# Patient Record
Sex: Female | Born: 1940
Health system: Southern US, Community
[De-identification: ages and names within clinical notes are randomized; demographics above are authoritative.]

## PROBLEM LIST (undated history)

## (undated) DIAGNOSIS — E785 Hyperlipidemia, unspecified: Secondary | ICD-10-CM

## (undated) DIAGNOSIS — I10 Essential (primary) hypertension: Secondary | ICD-10-CM

---

## 2006-05-25 ENCOUNTER — Ambulatory Visit (HOSPITAL_COMMUNITY): Admission: RE | Admit: 2006-05-25 | Discharge: 2006-05-25 | Payer: Self-pay | Admitting: Internal Medicine

## 2007-03-08 ENCOUNTER — Ambulatory Visit (HOSPITAL_COMMUNITY): Admission: RE | Admit: 2007-03-08 | Discharge: 2007-03-08 | Payer: Self-pay | Admitting: Internal Medicine

## 2007-06-12 ENCOUNTER — Ambulatory Visit (HOSPITAL_COMMUNITY): Admission: RE | Admit: 2007-06-12 | Discharge: 2007-06-12 | Payer: Self-pay | Admitting: Internal Medicine

## 2007-06-26 ENCOUNTER — Other Ambulatory Visit: Admission: RE | Admit: 2007-06-26 | Discharge: 2007-06-26 | Payer: Self-pay | Admitting: Obstetrics and Gynecology

## 2007-08-24 ENCOUNTER — Emergency Department (HOSPITAL_COMMUNITY): Admission: EM | Admit: 2007-08-24 | Discharge: 2007-08-24 | Payer: Self-pay | Admitting: Emergency Medicine

## 2008-05-19 ENCOUNTER — Ambulatory Visit (HOSPITAL_COMMUNITY): Admission: RE | Admit: 2008-05-19 | Discharge: 2008-05-19 | Payer: Self-pay | Admitting: Internal Medicine

## 2008-07-27 ENCOUNTER — Ambulatory Visit (HOSPITAL_COMMUNITY): Admission: RE | Admit: 2008-07-27 | Discharge: 2008-07-27 | Payer: Self-pay | Admitting: Internal Medicine

## 2009-08-16 ENCOUNTER — Ambulatory Visit (HOSPITAL_COMMUNITY): Admission: RE | Admit: 2009-08-16 | Discharge: 2009-08-16 | Payer: Self-pay | Admitting: Internal Medicine

## 2010-09-12 ENCOUNTER — Other Ambulatory Visit (HOSPITAL_COMMUNITY): Payer: Self-pay | Admitting: Internal Medicine

## 2010-09-12 DIAGNOSIS — Z139 Encounter for screening, unspecified: Secondary | ICD-10-CM

## 2010-09-20 ENCOUNTER — Ambulatory Visit (HOSPITAL_COMMUNITY)
Admission: RE | Admit: 2010-09-20 | Discharge: 2010-09-20 | Disposition: A | Discharge status: Home / Self Care | Payer: Medicare Other | Source: Ambulatory Visit | Attending: Internal Medicine | Admitting: Internal Medicine

## 2010-09-20 DIAGNOSIS — Z1231 Encounter for screening mammogram for malignant neoplasm of breast: Secondary | ICD-10-CM | POA: Insufficient documentation

## 2010-09-20 DIAGNOSIS — Z139 Encounter for screening, unspecified: Secondary | ICD-10-CM

## 2010-11-04 LAB — URINALYSIS, ROUTINE W REFLEX MICROSCOPIC
Bilirubin Urine: NEGATIVE
Ketones, ur: NEGATIVE
Nitrite: NEGATIVE
Protein, ur: 30 — AB
pH: 5

## 2010-11-04 LAB — POCT CARDIAC MARKERS
Operator id: 166561
Troponin i, poc: <0.05

## 2010-11-04 LAB — LIPASE, BLOOD: Lipase: 16

## 2010-11-04 LAB — URINE CULTURE

## 2010-11-04 LAB — DIFFERENTIAL
Eosinophils Absolute: 0
Lymphocytes Relative: 1 — ABNORMAL LOW
Monocytes Relative: 4

## 2010-11-04 LAB — BASIC METABOLIC PANEL
BUN: 14
CO2: 27
Calcium: 8.9
Creatinine, Ser: 1.58 — ABNORMAL HIGH
Glucose, Bld: 160 — ABNORMAL HIGH

## 2010-11-04 LAB — CBC
MCHC: 33.9
MCV: 95.4
RDW: 13

## 2010-11-04 LAB — URINE MICROSCOPIC-ADD ON

## 2011-01-24 ENCOUNTER — Other Ambulatory Visit (HOSPITAL_COMMUNITY): Payer: Self-pay | Admitting: Internal Medicine

## 2011-01-24 ENCOUNTER — Ambulatory Visit (HOSPITAL_COMMUNITY)
Admission: RE | Admit: 2011-01-24 | Discharge: 2011-01-24 | Disposition: A | Discharge status: Home / Self Care | Payer: Medicare Other | Source: Ambulatory Visit | Attending: Internal Medicine | Admitting: Internal Medicine

## 2011-01-24 DIAGNOSIS — M25559 Pain in unspecified hip: Secondary | ICD-10-CM | POA: Insufficient documentation

## 2011-01-24 DIAGNOSIS — D259 Leiomyoma of uterus, unspecified: Secondary | ICD-10-CM | POA: Insufficient documentation

## 2011-01-24 DIAGNOSIS — M25552 Pain in left hip: Secondary | ICD-10-CM

## 2011-06-27 ENCOUNTER — Other Ambulatory Visit (HOSPITAL_COMMUNITY): Payer: Self-pay | Admitting: Internal Medicine

## 2011-07-04 ENCOUNTER — Ambulatory Visit (HOSPITAL_COMMUNITY)
Admission: RE | Admit: 2011-07-04 | Discharge: 2011-07-04 | Disposition: A | Discharge status: Home / Self Care | Payer: Medicare Other | Source: Ambulatory Visit | Attending: Internal Medicine | Admitting: Internal Medicine

## 2011-07-04 DIAGNOSIS — Z780 Asymptomatic menopausal state: Secondary | ICD-10-CM | POA: Insufficient documentation

## 2011-07-04 DIAGNOSIS — E559 Vitamin D deficiency, unspecified: Secondary | ICD-10-CM | POA: Insufficient documentation

## 2011-10-04 ENCOUNTER — Other Ambulatory Visit (HOSPITAL_COMMUNITY): Payer: Self-pay | Admitting: Internal Medicine

## 2011-10-04 DIAGNOSIS — Z139 Encounter for screening, unspecified: Secondary | ICD-10-CM

## 2011-10-13 ENCOUNTER — Ambulatory Visit (HOSPITAL_COMMUNITY)
Admission: RE | Admit: 2011-10-13 | Discharge: 2011-10-13 | Disposition: A | Discharge status: Home / Self Care | Payer: Medicare Other | Source: Ambulatory Visit | Attending: Internal Medicine | Admitting: Internal Medicine

## 2011-10-13 DIAGNOSIS — Z1231 Encounter for screening mammogram for malignant neoplasm of breast: Secondary | ICD-10-CM | POA: Insufficient documentation

## 2011-10-13 DIAGNOSIS — Z139 Encounter for screening, unspecified: Secondary | ICD-10-CM

## 2012-12-02 ENCOUNTER — Other Ambulatory Visit (HOSPITAL_COMMUNITY): Payer: Self-pay | Admitting: Internal Medicine

## 2012-12-02 DIAGNOSIS — Z139 Encounter for screening, unspecified: Secondary | ICD-10-CM

## 2012-12-16 ENCOUNTER — Ambulatory Visit (HOSPITAL_COMMUNITY)
Admission: RE | Admit: 2012-12-16 | Discharge: 2012-12-16 | Disposition: A | Discharge status: Home / Self Care | Payer: Medicare Other | Source: Ambulatory Visit | Attending: Internal Medicine | Admitting: Internal Medicine

## 2012-12-16 DIAGNOSIS — Z139 Encounter for screening, unspecified: Secondary | ICD-10-CM

## 2012-12-16 DIAGNOSIS — Z1231 Encounter for screening mammogram for malignant neoplasm of breast: Secondary | ICD-10-CM | POA: Insufficient documentation

## 2013-01-30 ENCOUNTER — Telehealth: Payer: Self-pay | Admitting: Family Medicine

## 2013-01-30 NOTE — Telephone Encounter (Signed)
Paged by [facility] to the after hours telephone number.  Pt reported INR 4.0 today, currently on 5 mg qd.  Will hold today and tomorrow's dose and check in two days, told to call after hours line at that time to get further instructions on dosing.  Thanks, Twana First. Paulina Fusi, DO of [facility] 01/30/2013, 12:08 PM

## 2014-01-21 ENCOUNTER — Other Ambulatory Visit (HOSPITAL_COMMUNITY): Payer: Self-pay | Admitting: Internal Medicine

## 2014-01-21 DIAGNOSIS — Z1231 Encounter for screening mammogram for malignant neoplasm of breast: Secondary | ICD-10-CM

## 2014-01-26 ENCOUNTER — Ambulatory Visit (HOSPITAL_COMMUNITY)
Admission: RE | Admit: 2014-01-26 | Discharge: 2014-01-26 | Disposition: A | Discharge status: Home / Self Care | Payer: Medicare Other | Source: Ambulatory Visit | Attending: Internal Medicine | Admitting: Internal Medicine

## 2014-01-26 DIAGNOSIS — Z1231 Encounter for screening mammogram for malignant neoplasm of breast: Secondary | ICD-10-CM | POA: Insufficient documentation

## 2014-04-17 DIAGNOSIS — I10 Essential (primary) hypertension: Secondary | ICD-10-CM | POA: Diagnosis not present

## 2014-04-17 DIAGNOSIS — M1990 Unspecified osteoarthritis, unspecified site: Secondary | ICD-10-CM | POA: Diagnosis not present

## 2014-04-17 DIAGNOSIS — M539 Dorsopathy, unspecified: Secondary | ICD-10-CM | POA: Diagnosis not present

## 2014-04-17 DIAGNOSIS — E784 Other hyperlipidemia: Secondary | ICD-10-CM | POA: Diagnosis not present

## 2014-07-24 ENCOUNTER — Other Ambulatory Visit (HOSPITAL_COMMUNITY): Payer: Self-pay | Admitting: Internal Medicine

## 2014-07-24 DIAGNOSIS — M1990 Unspecified osteoarthritis, unspecified site: Secondary | ICD-10-CM

## 2014-07-24 DIAGNOSIS — R739 Hyperglycemia, unspecified: Secondary | ICD-10-CM | POA: Diagnosis not present

## 2014-07-24 DIAGNOSIS — E784 Other hyperlipidemia: Secondary | ICD-10-CM | POA: Diagnosis not present

## 2014-07-24 DIAGNOSIS — I10 Essential (primary) hypertension: Secondary | ICD-10-CM | POA: Diagnosis not present

## 2014-07-24 DIAGNOSIS — Z23 Encounter for immunization: Secondary | ICD-10-CM | POA: Diagnosis not present

## 2014-07-24 DIAGNOSIS — Z0000 Encounter for general adult medical examination without abnormal findings: Secondary | ICD-10-CM | POA: Diagnosis not present

## 2014-07-24 DIAGNOSIS — M539 Dorsopathy, unspecified: Secondary | ICD-10-CM | POA: Diagnosis not present

## 2014-07-28 ENCOUNTER — Other Ambulatory Visit (HOSPITAL_COMMUNITY): Payer: Self-pay | Admitting: Internal Medicine

## 2014-07-28 DIAGNOSIS — M1990 Unspecified osteoarthritis, unspecified site: Secondary | ICD-10-CM

## 2014-07-29 ENCOUNTER — Ambulatory Visit (HOSPITAL_COMMUNITY)
Admission: RE | Admit: 2014-07-29 | Discharge: 2014-07-29 | Disposition: A | Discharge status: Home / Self Care | Payer: Medicare Other | Source: Ambulatory Visit | Attending: Internal Medicine | Admitting: Internal Medicine

## 2014-07-29 DIAGNOSIS — Z780 Asymptomatic menopausal state: Secondary | ICD-10-CM | POA: Diagnosis not present

## 2014-07-29 DIAGNOSIS — M1990 Unspecified osteoarthritis, unspecified site: Secondary | ICD-10-CM | POA: Diagnosis not present

## 2014-07-29 DIAGNOSIS — M85851 Other specified disorders of bone density and structure, right thigh: Secondary | ICD-10-CM | POA: Diagnosis not present

## 2014-08-11 DIAGNOSIS — I10 Essential (primary) hypertension: Secondary | ICD-10-CM | POA: Diagnosis not present

## 2014-08-11 DIAGNOSIS — M542 Cervicalgia: Secondary | ICD-10-CM | POA: Diagnosis not present

## 2014-09-01 ENCOUNTER — Telehealth: Payer: Self-pay

## 2014-09-01 NOTE — Telephone Encounter (Signed)
PATIENT RECEIVED LETTER TO SCHEDULE COLONOSCOPY  PLEASE CALL [phone]

## 2014-09-02 ENCOUNTER — Telehealth: Payer: Self-pay

## 2014-09-02 ENCOUNTER — Other Ambulatory Visit: Payer: Self-pay

## 2014-09-02 DIAGNOSIS — Z1211 Encounter for screening for malignant neoplasm of colon: Secondary | ICD-10-CM

## 2014-09-02 NOTE — Telephone Encounter (Signed)
I have triaged pt.  

## 2014-09-02 NOTE — Telephone Encounter (Signed)
Patient called in to get her tcs scheduled.  I explained to the patient that Tamela Oddi is working on those triages and she will be calling her soon.

## 2014-09-03 NOTE — Telephone Encounter (Signed)
Gastroenterology Pre-Procedure Review  Request Date: 09/02/2014 Requesting Physician: Dr. Legrand Rams  PATIENT REVIEW QUESTIONS: The patient responded to the following health history questions as indicated:    PT SAID SHE HAD A COLONOSCOPY OVER 10 YEARS AGO IN [place] AND IT WAS OK  1. Diabetes Melitis: no 2. Joint replacements in the past 12 months: no 3. Major health problems in the past 3 months: no 4. Has an artificial valve or MVP: no 5. Has a defibrillator: no 6. Has been advised in past to take antibiotics in advance of a procedure like teeth cleaning: no    MEDICATIONS & ALLERGIES:    Patient reports the following regarding taking any blood thinners:   Plavix? no Aspirin? no Coumadin? no  Patient confirms/reports the following medications:  Current Outpatient Prescriptions  Medication Sig Dispense Refill  . lisinopril (PRINIVIL,ZESTRIL) 20 MG tablet Take 20 mg by mouth daily.    . Multiple Vitamin (MULTIVITAMIN) tablet Take 1 tablet by mouth daily.    . simvastatin (ZOCOR) 40 MG tablet Take 40 mg by mouth daily.     No current facility-administered medications for this visit.    Patient confirms/reports the following allergies:  No Known Allergies  No orders of the defined types were placed in this encounter.    AUTHORIZATION INFORMATION Primary Insurance:  ID #:   Group #:  Pre-Cert / Auth required:  Pre-Cert / Auth #:   Secondary Insurance:   ID #:  Group #:  Pre-Cert / Auth required:  Pre-Cert / Auth #:   SCHEDULE INFORMATION: Procedure has been scheduled as follows:  Date: 09/21/2014     Time: 1:45 PM Location: [facility]  This Gastroenterology Pre-Precedure Review Form is being routed to the following provider(s): Barney Drain, MD

## 2014-09-03 NOTE — Telephone Encounter (Signed)
SUPREP SPLIT DOSING- FULL LIQUIDS WITH BREAKFAST.   Full Liquid Diet A high-calorie, high-protein supplement should be used to meet your nutritional requirements when the full liquid diet is continued for more than 2 or 3 days. If this diet is to be used for an extended period of time (more than 7 days), a multivitamin should be considered.  Breads and Starches  Allowed: None are allowed except crackers WHOLE OR pureed (made into a thick, smooth soup) in soup.   Avoid: Any others.    Potatoes/Pasta/Rice  Allowed: ANY ITEM AS A SOUP OR SMALL PLATE OF MASHED POTATOES.       Vegetables  Allowed: Strained tomato or vegetable juice. Vegetables pureed in soup.   Avoid: Any others.    Fruit  Allowed: Any strained fruit juices and fruit drinks. Include 1 serving of citrus or vitamin C-enriched fruit juice daily.   Avoid: Any others.  Meat and Meat Substitutes  Allowed: Egg  Avoid: Any meat, fish, or fowl. All cheese.  Milk  Allowed: Milk beverages, including milk shakes and instant breakfast mixes. Smooth yogurt.   Avoid: Any others. Avoid dairy products if not tolerated.    Soups and Combination Foods  Allowed: Broth, strained cream soups. Strained, broth-based soups.   Avoid: Any others.    Desserts and Sweets  Allowed: flavored gelatin,plain ice cream, sherbet, smooth pudding, junket, fruit ices, frozen ice pops, pudding pops,, frozen fudge pops, chocolate syrup. Sugar, honey, jelly, syrup.   Avoid: Any others.  Fats and Oils  Allowed: Margarine, butter, cream, sour cream, oils.   Avoid: Any others.  Beverages  Allowed: All.   Avoid: None.  Condiments  Allowed: Iodized salt, pepper, spices, flavorings. Cocoa powder.   Avoid: Any others.    SAMPLE MEAL PLAN Breakfast   cup orange juice.   1 OR 2 EGGS   1 cup  milk.   1 cup beverage (coffee or tea).   Cream or sugar, if desired.    Midmorning Snack  2 SCRAMBLED OR HARD BOILED  EGG   Lunch  1 cup cream soup.    cup fruit juice.   1 cup milk.    cup custard.   1 cup beverage (coffee or tea).   Cream or sugar, if desired.    Midafternoon Snack  1 cup milk shake.  Dinner  1 cup cream soup.    cup fruit juice.   1 cup milk.    cup pudding.   1 cup beverage (coffee or tea).   Cream or sugar, if desired.  Evening Snack  1 cup supplement.  To increase calories, add sugar, cream, butter, or margarine if possible. Nutritional supplements will also increase the total calories.

## 2014-09-04 MED ORDER — NA SULFATE-K SULFATE-MG SULF 17.5-3.13-1.6 GM/177ML PO SOLN: 1.0000 | ORAL | Status: DC

## 2014-09-04 NOTE — Telephone Encounter (Signed)
Rx sent to the pharmacy and instructions mailed to pt.  

## 2014-09-07 ENCOUNTER — Telehealth: Payer: Self-pay

## 2014-09-07 NOTE — Telephone Encounter (Signed)
Per Ginger, the pharmacy called and the SUPREP was not covered by insurance. She gave verbal for Trilyte prep. I am mailing new instructions for the Trilyte prep and pt is aware to discard previous instructions.

## 2014-09-09 ENCOUNTER — Telehealth: Payer: Self-pay

## 2014-09-09 NOTE — Telephone Encounter (Signed)
I called [employer] @ [phone] and spoke to [place] who said that a PA is not required for screening colonoscopy.

## 2014-09-21 ENCOUNTER — Ambulatory Visit (HOSPITAL_COMMUNITY)
Admission: RE | Admit: 2014-09-21 | Discharge: 2014-09-21 | Disposition: A | Discharge status: Home / Self Care | Payer: Medicare Other | Source: Ambulatory Visit | Attending: Gastroenterology | Admitting: Gastroenterology

## 2014-09-21 ENCOUNTER — Encounter (HOSPITAL_COMMUNITY): Payer: Self-pay

## 2014-09-21 ENCOUNTER — Encounter (HOSPITAL_COMMUNITY)
Admission: RE | Disposition: A | Discharge status: Home / Self Care | Payer: Self-pay | Source: Ambulatory Visit | Attending: Gastroenterology

## 2014-09-21 DIAGNOSIS — Z87891 Personal history of nicotine dependence: Secondary | ICD-10-CM | POA: Insufficient documentation

## 2014-09-21 DIAGNOSIS — Q438 Other specified congenital malformations of intestine: Secondary | ICD-10-CM | POA: Insufficient documentation

## 2014-09-21 DIAGNOSIS — K648 Other hemorrhoids: Secondary | ICD-10-CM | POA: Insufficient documentation

## 2014-09-21 DIAGNOSIS — Z79899 Other long term (current) drug therapy: Secondary | ICD-10-CM | POA: Insufficient documentation

## 2014-09-21 DIAGNOSIS — I10 Essential (primary) hypertension: Secondary | ICD-10-CM | POA: Insufficient documentation

## 2014-09-21 DIAGNOSIS — Z1211 Encounter for screening for malignant neoplasm of colon: Secondary | ICD-10-CM

## 2014-09-21 DIAGNOSIS — K635 Polyp of colon: Secondary | ICD-10-CM | POA: Diagnosis not present

## 2014-09-21 DIAGNOSIS — K621 Rectal polyp: Secondary | ICD-10-CM

## 2014-09-21 DIAGNOSIS — E785 Hyperlipidemia, unspecified: Secondary | ICD-10-CM | POA: Diagnosis not present

## 2014-09-21 DIAGNOSIS — D127 Benign neoplasm of rectosigmoid junction: Secondary | ICD-10-CM | POA: Diagnosis not present

## 2014-09-21 DIAGNOSIS — K5730 Diverticulosis of large intestine without perforation or abscess without bleeding: Secondary | ICD-10-CM | POA: Insufficient documentation

## 2014-09-21 HISTORY — DX: Essential (primary) hypertension: I10

## 2014-09-21 HISTORY — DX: Hyperlipidemia, unspecified: E78.5

## 2014-09-21 HISTORY — PX: COLONOSCOPY: SHX5424

## 2014-09-21 SURGERY — COLONOSCOPY
Anesthesia: Moderate Sedation

## 2014-09-21 MED ORDER — MIDAZOLAM HCL 5 MG/5ML IJ SOLN
INTRAMUSCULAR | Status: AC
  Filled 2014-09-21: qty 10

## 2014-09-21 MED ORDER — SODIUM CHLORIDE 0.9 % IV SOLN
INTRAVENOUS | Status: DC
  Administered 2014-09-21: 14:00:00 via INTRAVENOUS

## 2014-09-21 MED ORDER — MEPERIDINE HCL 100 MG/ML IJ SOLN
INTRAMUSCULAR | Status: AC
  Filled 2014-09-21: qty 2

## 2014-09-21 MED ORDER — HYDROCORTISONE ACE-PRAMOXINE 1-1 % RE CREA: TOPICAL_CREAM | RECTAL | Status: AC

## 2014-09-21 MED ORDER — STERILE WATER FOR IRRIGATION IR SOLN
Status: DC | PRN
  Administered 2014-09-21: 15:00:00

## 2014-09-21 MED ORDER — MEPERIDINE HCL 100 MG/ML IJ SOLN
INTRAMUSCULAR | Status: DC | PRN
  Administered 2014-09-21 (×4): 25 mg via INTRAVENOUS

## 2014-09-21 MED ORDER — MIDAZOLAM HCL 5 MG/5ML IJ SOLN
INTRAMUSCULAR | Status: DC | PRN
  Administered 2014-09-21 (×3): 2 mg via INTRAVENOUS

## 2014-09-21 NOTE — Telephone Encounter (Addendum)
PT HAS NEVER BEEN ON COUMADIN. ROUTING TO DR. Langley Gauss CORRECTION.

## 2014-09-21 NOTE — Op Note (Addendum)
[facility] [address] [place], [address]   COLONOSCOPY PROCEDURE REPORT  PATIENT: Marisa Graham, Marisa Graham  MR#: [id number] BIRTHDATE: 1940-09-22 , 73  yrs. old GENDER: female ENDOSCOPIST: Danie Binder, MD REFERRED EY:CXKGYJEH Fanta, M.D. PROCEDURE DATE:  10/19/14 PROCEDURE:   Colonoscopy with cold biopsy polypectomy INDICATIONS:average risk patient for colon cancer. MEDICATIONS: Demerol 100 mg IV and Versed 6 mg IV  DESCRIPTION OF PROCEDURE:    Physical exam was performed.  Informed consent was obtained from the patient after explaining the benefits, risks, and alternatives to procedure.  The patient was connected to monitor and placed in left lateral position. Continuous oxygen was provided by nasal cannula and IV medicine administered through an indwelling cannula.  After administration of sedation and rectal exam, the patients rectum was intubated and the EC-[id number] ([id number])  colonoscope was advanced under direct visualization to the cecum.  The scope was removed slowly by carefully examining the color, texture, anatomy, and integrity mucosa on the way out.  The patient was recovered in endoscopy and discharged home in satisfactory condition. Estimated blood loss is zero unless otherwise noted in this procedure report.    COLON FINDINGS: The colon was redundant.  The patient was moved on to their back to reach the cecum, There was moderate diverticulosis noted in the sigmoid colon and descending colon with associated muscular hypertrophy, colonic narrowing and angulation AT THE RECTOSIGMOID JUNCTION. DIFFICULT TO PASS ADULT COLONOSCOPE. Two sessile polyps ranging from 2 to 63mm in size were found in the rectum and sigmoid colon.  A polypectomy was performed with cold forceps.  , and Moderate sized internal hemorrhoids were found.  PREP QUALITY: excellent.  CECAL W/D TIME: 13       minutes COMPLICATIONS: Pt agitated in spite of adequate sedation.  No recall.  ENDOSCOPIC IMPRESSION: 1.   The LEFT colon IS redundant 2.   Moderate diverticulosis  in the sigmoid colon and descending colon 3.   Two POLYPS REMOVED 4.   Moderate sized internal hemorrhoids  RECOMMENDATIONS: DRINK WATER HIGH FIBER DIET NEXT TCS IN 10-15 YEARS IF THE BENEFITS OUTWEIGH THE RISKS    _______________________________ eSignedDanie Binder, MD 10-19-14 4:17 PM Revised: 10-19-2014 4:17 PM   CPT CODES: ICD CODES:  The ICD and CPT codes recommended by this software are interpretations from the data that the clinical staff has captured with the software.  The verification of the translation of this report to the ICD and CPT codes and modifiers is the sole responsibility of the health care institution and practicing physician where this report was generated.  [employer] will not be held responsible for the validity of the ICD and CPT codes included on this report.  AMA assumes no liability for data contained or not contained herein. CPT is a Designer, television/film set of the [employer].

## 2014-09-21 NOTE — Discharge Instructions (Signed)
You had 2 small polyps removed. You have internal hemorrhoids and diverticulosis IN YOUR RIGHT AND LEFT COLON.   DRINK WATER TO KEEP YOUR URINE LIGHT YELLOW.  FOLLOW A HIGH FIBER DIET. AVOID ITEMS THAT CAUSE BLOATING. SEE INFO BELOW.  USE PREPARATION H or Proctocream 2 TO 4 TIMES A DAY AS NEEDED TO RELIEVE RECTAL PRESSURE/ITCHING/BLEEDING.  YOUR BIOPSY RESULTS WILL BE AVAILABLE IN MY CHART AFTER AUG 17 AND MY OFFICE WILL CONTACT YOU IN 10-14 DAYS WITH YOUR RESULTS.   Next colonoscopy in 10-15 years IF THE BENEFITS OUTWEIGH THE RISKS.  Colonoscopy Care After Read the instructions outlined below and refer to this sheet in the next week. These discharge instructions provide you with general information on caring for yourself after you leave the hospital. While your treatment has been planned according to the most current medical practices available, unavoidable complications occasionally occur. If you have any problems or questions after discharge, call DR. Haifa Hatton, [phone].  ACTIVITY  You may resume your regular activity, but move at a slower pace for the next 24 hours.   Take frequent rest periods for the next 24 hours.   Walking will help get rid of the air and reduce the bloated feeling in your belly (abdomen).   No driving for 24 hours (because of the medicine (anesthesia) used during the test).   You may shower.   Do not sign any important legal documents or operate any machinery for 24 hours (because of the anesthesia used during the test).    NUTRITION  Drink plenty of fluids.   You may resume your normal diet as instructed by your doctor.   Begin with a light meal and progress to your normal diet. Heavy or fried foods are harder to digest and may make you feel sick to your stomach (nauseated).   Avoid alcoholic beverages for 24 hours or as instructed.    MEDICATIONS  You may resume your normal medications.   WHAT YOU CAN EXPECT TODAY  Some feelings of  bloating in the abdomen.   Passage of more gas than usual.   Spotting of blood in your stool or on the toilet paper  .  IF YOU HAD POLYPS REMOVED DURING THE COLONOSCOPY:  Eat a soft diet IF YOU HAVE NAUSEA, BLOATING, ABDOMINAL PAIN, OR VOMITING.    FINDING OUT THE RESULTS OF YOUR TEST Not all test results are available during your visit. DR. Oneida Alar WILL CALL YOU WITHIN 7 DAYS OF YOUR PROCEDUE WITH YOUR RESULTS. Do not assume everything is normal if you have not heard from DR. Mickle Campton IN ONE WEEK, CALL HER OFFICE AT [phone].  SEEK IMMEDIATE MEDICAL ATTENTION AND CALL THE OFFICE: [phone] IF:  You have more than a spotting of blood in your stool.   Your belly is swollen (abdominal distention).   You are nauseated or vomiting.   You have a temperature over 101F.   You have abdominal pain or discomfort that is severe or gets worse throughout the day.  Polyps, Colon  A polyp is extra tissue that grows inside your body. Colon polyps grow in the large intestine. The large intestine, also called the colon, is part of your digestive system. It is a long, hollow tube at the end of your digestive tract where your body makes and stores stool. Most polyps are not dangerous. They are benign. This means they are not cancerous. But over time, some types of polyps can turn into cancer. Polyps that are smaller than a pea  are usually not harmful. But larger polyps could someday become or may already be cancerous. To be safe, doctors remove all polyps and test them.   WHO GETS POLYPS? Anyone can get polyps, but certain people are more likely than others. You may have a greater chance of getting polyps if:  You are over 50.   You have had polyps before.   Someone in your family has had polyps.   Someone in your family has had cancer of the large intestine.   Find out if someone in your family has had polyps. You may also be more likely to get polyps if you:   Eat a lot of fatty foods    Smoke   Drink alcohol   Do not exercise  Eat too much   TREATMENT  The caregiver will remove the polyp during sigmoidoscopy or colonoscopy.  PREVENTION There is not one sure way to prevent polyps. You might be able to lower your risk of getting them if you:  Eat more fruits and vegetables and less fatty food.   Do not smoke.   Avoid alcohol.   Exercise every day.   Lose weight if you are overweight.   Eating more calcium and folate can also lower your risk of getting polyps. Some foods that are rich in calcium are milk, cheese, and broccoli. Some foods that are rich in folate are chickpeas, kidney beans, and spinach.   High-Fiber Diet A high-fiber diet changes your normal diet to include more whole grains, legumes, fruits, and vegetables. Changes in the diet involve replacing refined carbohydrates with unrefined foods. The calorie level of the diet is essentially unchanged. The Dietary Reference Intake (recommended amount) for adult males is 38 grams per day. For adult females, it is 25 grams per day. Pregnant and lactating women should consume 28 grams of fiber per day. Fiber is the intact part of a plant that is not broken down during digestion. Functional fiber is fiber that has been isolated from the plant to provide a beneficial effect in the body. PURPOSE  Increase stool bulk.   Ease and regulate bowel movements.   Lower cholesterol.  INDICATIONS THAT YOU NEED MORE FIBER  Constipation and hemorrhoids.   Uncomplicated diverticulosis (intestine condition) and irritable bowel syndrome.   Weight management.   As a protective measure against hardening of the arteries (atherosclerosis), diabetes, and cancer.   GUIDELINES FOR INCREASING FIBER IN THE DIET  Start adding fiber to the diet slowly. A gradual increase of about 5 more grams (2 slices of whole-wheat bread, 2 servings of most fruits or vegetables, or 1 bowl of high-fiber cereal) per day is best. Too rapid an  increase in fiber may result in constipation, flatulence, and bloating.   Drink enough water and fluids to keep your urine clear or pale yellow. Water, juice, or caffeine-free drinks are recommended. Not drinking enough fluid may cause constipation.   Eat a variety of high-fiber foods rather than one type of fiber.   Try to increase your intake of fiber through using high-fiber foods rather than fiber pills or supplements that contain small amounts of fiber.   The goal is to change the types of food eaten. Do not supplement your present diet with high-fiber foods, but replace foods in your present diet.  INCLUDE A VARIETY OF FIBER SOURCES  Replace refined and processed grains with whole grains, canned fruits with fresh fruits, and incorporate other fiber sources. White rice, white breads, and most bakery goods  contain little or no fiber.   Brown whole-grain rice, buckwheat oats, and many fruits and vegetables are all good sources of fiber. These include: broccoli, Brussels sprouts, cabbage, cauliflower, beets, sweet potatoes, white potatoes (skin on), carrots, tomatoes, eggplant, squash, berries, fresh fruits, and dried fruits.   Cereals appear to be the richest source of fiber. Cereal fiber is found in whole grains and bran. Bran is the fiber-rich outer coat of cereal grain, which is largely removed in refining. In whole-grain cereals, the bran remains. In breakfast cereals, the largest amount of fiber is found in those with "bran" in their names. The fiber content is sometimes indicated on the label.   You may need to include additional fruits and vegetables each day.   In baking, for 1 cup white flour, you may use the following substitutions:   1 cup whole-wheat flour minus 2 tablespoons.   1/2 cup white flour plus 1/2 cup whole-wheat flour.   Diverticulosis Diverticulosis is a common condition that develops when small pouches (diverticula) form in the wall of the colon. The risk of  diverticulosis increases with age. It happens more often in people who eat a low-fiber diet. Most individuals with diverticulosis have no symptoms. Those individuals with symptoms usually experience belly (abdominal) pain, constipation, or loose stools (diarrhea).  HOME CARE INSTRUCTIONS  Increase the amount of fiber in your diet as directed by your caregiver or dietician. This may reduce symptoms of diverticulosis.   Drink at least 6 to 8 glasses of water each day to prevent constipation.   Try not to strain when you have a bowel movement.   Avoiding nuts and seeds to prevent complications is still an uncertain benefit.       FOODS HAVING HIGH FIBER CONTENT INCLUDE:  Fruits. Apple, peach, pear, tangerine, raisins, prunes.   Vegetables. Brussels sprouts, asparagus, broccoli, cabbage, carrot, cauliflower, romaine lettuce, spinach, summer squash, tomato, winter squash, zucchini.   Starchy Vegetables. Baked beans, kidney beans, lima beans, split peas, lentils, potatoes (with skin).   Grains. Whole wheat bread, brown rice, bran flake cereal, plain oatmeal, white rice, shredded wheat, bran muffins.    SEEK IMMEDIATE MEDICAL CARE IF:  You develop increasing pain or severe bloating.   You have an oral temperature above 101F.   You develop vomiting or bowel movements that are bloody or black.   Hemorrhoids Hemorrhoids are dilated (enlarged) veins around the rectum. Sometimes clots will form in the veins. This makes them swollen and painful. These are called thrombosed hemorrhoids. Causes of hemorrhoids include:  Constipation.   Straining to have a bowel movement.   HEAVY LIFTING HOME CARE INSTRUCTIONS  Eat a well balanced diet and drink 6 to 8 glasses of water every day to avoid constipation. You may also use a bulk laxative.   Avoid straining to have bowel movements.   Keep anal area dry and clean.   Do not use a donut shaped pillow or sit on the toilet for long periods.  This increases blood pooling and pain.   Move your bowels when your body has the urge; this will require less straining and will decrease pain and pressure.

## 2014-09-21 NOTE — H&P (Signed)
  Primary Care Physician:  Rosita Fire, MD Primary Gastroenterologist:  Dr. Oneida Alar  Pre-Procedure History & Physical: HPI:  Marisa Graham is a 74 y.o. female here for [place].  Past Medical History  Diagnosis Date  . Hypertension   . Hyperlipidemia     History reviewed. No pertinent past surgical history.  Prior to Admission medications   Medication Sig Start Date End Date Taking? Authorizing Provider  atorvastatin (LIPITOR) 40 MG tablet Take 40 mg by mouth daily.   Yes Historical Provider, MD  lisinopril-hydrochlorothiazide (PRINZIDE,ZESTORETIC) 20-12.5 MG per tablet Take 1 tablet by mouth daily.   Yes Historical Provider, MD  Multiple Vitamin (MULTIVITAMIN) tablet Take 1 tablet by mouth daily.   Yes Historical Provider, MD  Na Sulfate-K Sulfate-Mg Sulf (SUPREP BOWEL PREP) SOLN Take 1 kit by mouth as directed. 09/04/14  Yes Danie Binder, MD  Vitamin D, Ergocalciferol, (DRISDOL) 50000 UNITS CAPS capsule Take 50,000 Units by mouth every 7 (seven) days.   Yes Historical Provider, MD    Allergies as of 09/02/2014  . (No Known Allergies)    History reviewed. No pertinent family history.  Social History   Social History  . Marital Status: Married    Spouse Name: N/A  . Number of Children: N/A  . Years of Education: N/A   Occupational History  . Not on file.   Social History Main Topics  . Smoking status: Former Smoker    Quit date: 09/20/1984  . Smokeless tobacco: Not on file  . Alcohol Use: No  . Drug Use: No  . Sexual Activity: Not on file   Other Topics Concern  . Not on file   Social History Narrative  . No narrative on file    Review of Systems: See HPI, otherwise negative ROS   Physical Exam: Ht $Remove'5\' 2"'pOQrHVF$  (1.575 m)  Wt 133 lb (60.328 kg)  BMI 24.32 kg/m2 General:   Alert,  pleasant and cooperative in NAD Head:  Normocephalic and atraumatic. Neck:  Supple; Lungs:  Clear throughout to auscultation.    Heart:  Regular rate and  rhythm. Abdomen:  Soft, nontender and nondistended. Normal bowel sounds, without guarding, and without rebound.   Neurologic:  Alert and  oriented x4;  grossly normal neurologically.  Impression/Plan:     SCREENING  Plan:  1. TCS TODAY

## 2014-09-23 ENCOUNTER — Telehealth: Payer: Self-pay | Admitting: Gastroenterology

## 2014-09-24 NOTE — Telephone Encounter (Signed)
Please call pt. She had ONE HYPERPLASTIC POLYP AND ONE PERINEUROMA removed. THEY ARE BOTH BENIGN.   DRINK WATER TO KEEP YOUR URINE LIGHT YELLOW.  FOLLOW A HIGH FIBER DIET. AVOID ITEMS THAT CAUSE BLOATING.   USE PREPARATION H or Proctocream 2 TO 4 TIMES A DAY AS NEEDED TO RELIEVE RECTAL PRESSURE/ITCHING/BLEEDING.  Next colonoscopy in 10-15 years IF THE BENEFITS OUTWEIGH THE RISKS.

## 2014-09-24 NOTE — Telephone Encounter (Signed)
Reminder in epic °

## 2014-09-25 ENCOUNTER — Encounter (HOSPITAL_COMMUNITY): Payer: Self-pay | Admitting: Gastroenterology

## 2014-09-25 NOTE — Telephone Encounter (Signed)
Pt is aware of results. 

## 2014-10-30 DIAGNOSIS — Z23 Encounter for immunization: Secondary | ICD-10-CM | POA: Diagnosis not present

## 2014-10-30 DIAGNOSIS — M1990 Unspecified osteoarthritis, unspecified site: Secondary | ICD-10-CM | POA: Diagnosis not present

## 2014-10-30 DIAGNOSIS — I10 Essential (primary) hypertension: Secondary | ICD-10-CM | POA: Diagnosis not present

## 2014-12-21 ENCOUNTER — Other Ambulatory Visit (HOSPITAL_COMMUNITY): Payer: Self-pay | Admitting: Internal Medicine

## 2014-12-21 DIAGNOSIS — Z1231 Encounter for screening mammogram for malignant neoplasm of breast: Secondary | ICD-10-CM

## 2015-01-22 DIAGNOSIS — E784 Other hyperlipidemia: Secondary | ICD-10-CM | POA: Diagnosis not present

## 2015-01-22 DIAGNOSIS — M539 Dorsopathy, unspecified: Secondary | ICD-10-CM | POA: Diagnosis not present

## 2015-01-22 DIAGNOSIS — I10 Essential (primary) hypertension: Secondary | ICD-10-CM | POA: Diagnosis not present

## 2015-01-29 ENCOUNTER — Ambulatory Visit (HOSPITAL_COMMUNITY)
Admission: RE | Admit: 2015-01-29 | Discharge: 2015-01-29 | Disposition: A | Discharge status: Home / Self Care | Payer: Medicare Other | Source: Ambulatory Visit | Attending: Internal Medicine | Admitting: Internal Medicine

## 2015-01-29 DIAGNOSIS — Z1231 Encounter for screening mammogram for malignant neoplasm of breast: Secondary | ICD-10-CM | POA: Insufficient documentation

## 2015-04-30 DIAGNOSIS — M539 Dorsopathy, unspecified: Secondary | ICD-10-CM | POA: Diagnosis not present

## 2015-04-30 DIAGNOSIS — E784 Other hyperlipidemia: Secondary | ICD-10-CM | POA: Diagnosis not present

## 2015-04-30 DIAGNOSIS — I10 Essential (primary) hypertension: Secondary | ICD-10-CM | POA: Diagnosis not present

## 2015-04-30 DIAGNOSIS — M1990 Unspecified osteoarthritis, unspecified site: Secondary | ICD-10-CM | POA: Diagnosis not present

## 2015-04-30 DIAGNOSIS — R739 Hyperglycemia, unspecified: Secondary | ICD-10-CM | POA: Diagnosis not present

## 2015-08-27 DIAGNOSIS — I10 Essential (primary) hypertension: Secondary | ICD-10-CM | POA: Diagnosis not present

## 2015-08-27 DIAGNOSIS — E784 Other hyperlipidemia: Secondary | ICD-10-CM | POA: Diagnosis not present

## 2015-11-03 DIAGNOSIS — Z23 Encounter for immunization: Secondary | ICD-10-CM | POA: Diagnosis not present

## 2015-11-08 DIAGNOSIS — E784 Other hyperlipidemia: Secondary | ICD-10-CM | POA: Diagnosis not present

## 2015-11-08 DIAGNOSIS — E785 Hyperlipidemia, unspecified: Secondary | ICD-10-CM | POA: Diagnosis not present

## 2015-11-08 DIAGNOSIS — I10 Essential (primary) hypertension: Secondary | ICD-10-CM | POA: Diagnosis not present

## 2015-12-02 DIAGNOSIS — I10 Essential (primary) hypertension: Secondary | ICD-10-CM | POA: Diagnosis not present

## 2015-12-02 DIAGNOSIS — E784 Other hyperlipidemia: Secondary | ICD-10-CM | POA: Diagnosis not present

## 2016-01-07 ENCOUNTER — Other Ambulatory Visit (HOSPITAL_COMMUNITY): Payer: Self-pay | Admitting: Internal Medicine

## 2016-01-07 DIAGNOSIS — Z1231 Encounter for screening mammogram for malignant neoplasm of breast: Secondary | ICD-10-CM

## 2016-02-02 ENCOUNTER — Ambulatory Visit (HOSPITAL_COMMUNITY): Payer: Medicare Other

## 2016-02-11 ENCOUNTER — Ambulatory Visit (HOSPITAL_COMMUNITY)
Admission: RE | Admit: 2016-02-11 | Discharge: 2016-02-11 | Disposition: A | Discharge status: Home / Self Care | Payer: Medicare Other | Source: Ambulatory Visit | Attending: Internal Medicine | Admitting: Internal Medicine

## 2016-02-11 DIAGNOSIS — Z1231 Encounter for screening mammogram for malignant neoplasm of breast: Secondary | ICD-10-CM | POA: Insufficient documentation

## 2016-02-28 DIAGNOSIS — I10 Essential (primary) hypertension: Secondary | ICD-10-CM | POA: Diagnosis not present

## 2016-02-28 DIAGNOSIS — J069 Acute upper respiratory infection, unspecified: Secondary | ICD-10-CM | POA: Diagnosis not present

## 2016-04-20 DIAGNOSIS — I10 Essential (primary) hypertension: Secondary | ICD-10-CM | POA: Diagnosis not present

## 2016-04-20 DIAGNOSIS — E784 Other hyperlipidemia: Secondary | ICD-10-CM | POA: Diagnosis not present

## 2016-04-20 DIAGNOSIS — M1990 Unspecified osteoarthritis, unspecified site: Secondary | ICD-10-CM | POA: Diagnosis not present

## 2016-04-26 DIAGNOSIS — M2012 Hallux valgus (acquired), left foot: Secondary | ICD-10-CM | POA: Diagnosis not present

## 2016-04-26 DIAGNOSIS — M2011 Hallux valgus (acquired), right foot: Secondary | ICD-10-CM | POA: Diagnosis not present

## 2016-04-26 DIAGNOSIS — B351 Tinea unguium: Secondary | ICD-10-CM | POA: Diagnosis not present

## 2016-06-05 DIAGNOSIS — M539 Dorsopathy, unspecified: Secondary | ICD-10-CM | POA: Diagnosis not present

## 2016-06-05 DIAGNOSIS — R739 Hyperglycemia, unspecified: Secondary | ICD-10-CM | POA: Diagnosis not present

## 2016-06-05 DIAGNOSIS — M1990 Unspecified osteoarthritis, unspecified site: Secondary | ICD-10-CM | POA: Diagnosis not present

## 2016-06-05 DIAGNOSIS — I10 Essential (primary) hypertension: Secondary | ICD-10-CM | POA: Diagnosis not present

## 2016-06-05 DIAGNOSIS — E784 Other hyperlipidemia: Secondary | ICD-10-CM | POA: Diagnosis not present

## 2016-06-14 DIAGNOSIS — M1990 Unspecified osteoarthritis, unspecified site: Secondary | ICD-10-CM | POA: Diagnosis not present

## 2016-06-14 DIAGNOSIS — Z1389 Encounter for screening for other disorder: Secondary | ICD-10-CM | POA: Diagnosis not present

## 2016-06-14 DIAGNOSIS — E784 Other hyperlipidemia: Secondary | ICD-10-CM | POA: Diagnosis not present

## 2016-06-14 DIAGNOSIS — I10 Essential (primary) hypertension: Secondary | ICD-10-CM | POA: Diagnosis not present

## 2016-07-11 ENCOUNTER — Other Ambulatory Visit (HOSPITAL_COMMUNITY): Payer: Self-pay | Admitting: Internal Medicine

## 2016-07-11 ENCOUNTER — Ambulatory Visit (HOSPITAL_COMMUNITY)
Admission: RE | Admit: 2016-07-11 | Discharge: 2016-07-11 | Disposition: A | Discharge status: Home / Self Care | Payer: Medicare Other | Source: Ambulatory Visit | Attending: Internal Medicine | Admitting: Internal Medicine

## 2016-07-11 DIAGNOSIS — M25551 Pain in right hip: Secondary | ICD-10-CM | POA: Insufficient documentation

## 2016-07-11 DIAGNOSIS — M8580 Other specified disorders of bone density and structure, unspecified site: Secondary | ICD-10-CM | POA: Diagnosis not present

## 2016-07-11 DIAGNOSIS — D259 Leiomyoma of uterus, unspecified: Secondary | ICD-10-CM | POA: Diagnosis not present

## 2016-07-11 DIAGNOSIS — I70298 Other atherosclerosis of native arteries of extremities, other extremity: Secondary | ICD-10-CM | POA: Diagnosis not present

## 2016-09-14 DIAGNOSIS — I10 Essential (primary) hypertension: Secondary | ICD-10-CM | POA: Diagnosis not present

## 2016-09-14 DIAGNOSIS — E559 Vitamin D deficiency, unspecified: Secondary | ICD-10-CM | POA: Diagnosis not present

## 2016-09-14 DIAGNOSIS — E784 Other hyperlipidemia: Secondary | ICD-10-CM | POA: Diagnosis not present

## 2016-09-14 DIAGNOSIS — M1990 Unspecified osteoarthritis, unspecified site: Secondary | ICD-10-CM | POA: Diagnosis not present

## 2016-10-24 DIAGNOSIS — Z23 Encounter for immunization: Secondary | ICD-10-CM | POA: Diagnosis not present

## 2016-11-24 DIAGNOSIS — I10 Essential (primary) hypertension: Secondary | ICD-10-CM | POA: Diagnosis not present

## 2016-12-15 DIAGNOSIS — I10 Essential (primary) hypertension: Secondary | ICD-10-CM | POA: Diagnosis not present

## 2016-12-19 DIAGNOSIS — E7849 Other hyperlipidemia: Secondary | ICD-10-CM | POA: Diagnosis not present

## 2016-12-19 DIAGNOSIS — I10 Essential (primary) hypertension: Secondary | ICD-10-CM | POA: Diagnosis not present

## 2017-01-22 ENCOUNTER — Other Ambulatory Visit (HOSPITAL_COMMUNITY): Payer: Self-pay | Admitting: Internal Medicine

## 2017-01-22 DIAGNOSIS — Z1231 Encounter for screening mammogram for malignant neoplasm of breast: Secondary | ICD-10-CM

## 2017-02-12 ENCOUNTER — Ambulatory Visit (HOSPITAL_COMMUNITY)
Admission: RE | Admit: 2017-02-12 | Discharge: 2017-02-12 | Disposition: A | Discharge status: Home / Self Care | Payer: Medicare Other | Source: Ambulatory Visit | Attending: Internal Medicine | Admitting: Internal Medicine

## 2017-02-12 DIAGNOSIS — Z1231 Encounter for screening mammogram for malignant neoplasm of breast: Secondary | ICD-10-CM | POA: Diagnosis not present

## 2017-03-19 DIAGNOSIS — M1990 Unspecified osteoarthritis, unspecified site: Secondary | ICD-10-CM | POA: Diagnosis not present

## 2017-03-19 DIAGNOSIS — E7849 Other hyperlipidemia: Secondary | ICD-10-CM | POA: Diagnosis not present

## 2017-03-19 DIAGNOSIS — I10 Essential (primary) hypertension: Secondary | ICD-10-CM | POA: Diagnosis not present

## 2017-03-28 DIAGNOSIS — M79671 Pain in right foot: Secondary | ICD-10-CM | POA: Diagnosis not present

## 2017-03-28 DIAGNOSIS — B351 Tinea unguium: Secondary | ICD-10-CM | POA: Diagnosis not present

## 2017-03-28 DIAGNOSIS — M79672 Pain in left foot: Secondary | ICD-10-CM | POA: Diagnosis not present

## 2017-06-11 ENCOUNTER — Ambulatory Visit (HOSPITAL_COMMUNITY)
Admission: RE | Admit: 2017-06-11 | Discharge: 2017-06-11 | Disposition: A | Discharge status: Home / Self Care | Payer: Medicare Other | Source: Ambulatory Visit | Attending: Internal Medicine | Admitting: Internal Medicine

## 2017-06-11 ENCOUNTER — Other Ambulatory Visit (HOSPITAL_COMMUNITY): Payer: Self-pay | Admitting: Internal Medicine

## 2017-06-11 DIAGNOSIS — M25572 Pain in left ankle and joints of left foot: Secondary | ICD-10-CM | POA: Insufficient documentation

## 2017-06-11 DIAGNOSIS — M7989 Other specified soft tissue disorders: Secondary | ICD-10-CM | POA: Insufficient documentation

## 2017-06-11 DIAGNOSIS — I10 Essential (primary) hypertension: Secondary | ICD-10-CM | POA: Diagnosis not present

## 2017-06-18 DIAGNOSIS — E7849 Other hyperlipidemia: Secondary | ICD-10-CM | POA: Diagnosis not present

## 2017-06-18 DIAGNOSIS — M1990 Unspecified osteoarthritis, unspecified site: Secondary | ICD-10-CM | POA: Diagnosis not present

## 2017-06-18 DIAGNOSIS — I10 Essential (primary) hypertension: Secondary | ICD-10-CM | POA: Diagnosis not present

## 2017-06-18 DIAGNOSIS — R739 Hyperglycemia, unspecified: Secondary | ICD-10-CM | POA: Diagnosis not present

## 2017-06-18 DIAGNOSIS — Z1389 Encounter for screening for other disorder: Secondary | ICD-10-CM | POA: Diagnosis not present

## 2017-06-18 DIAGNOSIS — Z1331 Encounter for screening for depression: Secondary | ICD-10-CM | POA: Diagnosis not present

## 2017-06-18 DIAGNOSIS — Z0000 Encounter for general adult medical examination without abnormal findings: Secondary | ICD-10-CM | POA: Diagnosis not present

## 2017-06-18 DIAGNOSIS — M539 Dorsopathy, unspecified: Secondary | ICD-10-CM | POA: Diagnosis not present

## 2017-07-19 DIAGNOSIS — H25813 Combined forms of age-related cataract, bilateral: Secondary | ICD-10-CM | POA: Diagnosis not present

## 2017-09-20 DIAGNOSIS — M539 Dorsopathy, unspecified: Secondary | ICD-10-CM | POA: Diagnosis not present

## 2017-09-20 DIAGNOSIS — M1990 Unspecified osteoarthritis, unspecified site: Secondary | ICD-10-CM | POA: Diagnosis not present

## 2017-09-20 DIAGNOSIS — D649 Anemia, unspecified: Secondary | ICD-10-CM | POA: Diagnosis not present

## 2017-09-20 DIAGNOSIS — R739 Hyperglycemia, unspecified: Secondary | ICD-10-CM | POA: Diagnosis not present

## 2017-09-20 DIAGNOSIS — I10 Essential (primary) hypertension: Secondary | ICD-10-CM | POA: Diagnosis not present

## 2017-10-29 DIAGNOSIS — Z23 Encounter for immunization: Secondary | ICD-10-CM | POA: Diagnosis not present

## 2017-12-24 DIAGNOSIS — M12271 Villonodular synovitis (pigmented), right ankle and foot: Secondary | ICD-10-CM | POA: Diagnosis not present

## 2017-12-24 DIAGNOSIS — G5762 Lesion of plantar nerve, left lower limb: Secondary | ICD-10-CM | POA: Diagnosis not present

## 2017-12-24 DIAGNOSIS — M12272 Villonodular synovitis (pigmented), left ankle and foot: Secondary | ICD-10-CM | POA: Diagnosis not present

## 2017-12-24 DIAGNOSIS — M25775 Osteophyte, left foot: Secondary | ICD-10-CM | POA: Diagnosis not present

## 2017-12-24 DIAGNOSIS — M79672 Pain in left foot: Secondary | ICD-10-CM | POA: Diagnosis not present

## 2017-12-24 DIAGNOSIS — M25774 Osteophyte, right foot: Secondary | ICD-10-CM | POA: Diagnosis not present

## 2017-12-24 DIAGNOSIS — M79671 Pain in right foot: Secondary | ICD-10-CM | POA: Diagnosis not present

## 2018-01-02 DIAGNOSIS — M1990 Unspecified osteoarthritis, unspecified site: Secondary | ICD-10-CM | POA: Diagnosis not present

## 2018-01-02 DIAGNOSIS — E7849 Other hyperlipidemia: Secondary | ICD-10-CM | POA: Diagnosis not present

## 2018-01-02 DIAGNOSIS — I10 Essential (primary) hypertension: Secondary | ICD-10-CM | POA: Diagnosis not present

## 2018-01-14 ENCOUNTER — Other Ambulatory Visit (HOSPITAL_COMMUNITY): Payer: Self-pay | Admitting: Internal Medicine

## 2018-01-14 DIAGNOSIS — Z1231 Encounter for screening mammogram for malignant neoplasm of breast: Secondary | ICD-10-CM

## 2018-01-20 ENCOUNTER — Emergency Department (HOSPITAL_COMMUNITY)
Admission: EM | Admit: 2018-01-20 | Discharge: 2018-01-20 | Disposition: A | Discharge status: Home / Self Care | Payer: Medicare Other | Attending: Emergency Medicine | Admitting: Emergency Medicine

## 2018-01-20 ENCOUNTER — Emergency Department (HOSPITAL_COMMUNITY): Payer: Medicare Other

## 2018-01-20 ENCOUNTER — Other Ambulatory Visit: Payer: Self-pay

## 2018-01-20 ENCOUNTER — Encounter (HOSPITAL_COMMUNITY): Payer: Self-pay | Admitting: Emergency Medicine

## 2018-01-20 DIAGNOSIS — R42 Dizziness and giddiness: Secondary | ICD-10-CM | POA: Diagnosis not present

## 2018-01-20 DIAGNOSIS — Z79899 Other long term (current) drug therapy: Secondary | ICD-10-CM | POA: Diagnosis not present

## 2018-01-20 DIAGNOSIS — Z87891 Personal history of nicotine dependence: Secondary | ICD-10-CM | POA: Insufficient documentation

## 2018-01-20 DIAGNOSIS — I10 Essential (primary) hypertension: Secondary | ICD-10-CM | POA: Diagnosis not present

## 2018-01-20 DIAGNOSIS — R509 Fever, unspecified: Secondary | ICD-10-CM

## 2018-01-20 LAB — BASIC METABOLIC PANEL
Anion gap: 7 (ref 5–15)
BUN: 12 mg/dL (ref 8–23)
CO2: 27 mmol/L (ref 22–32)
Calcium: 8.9 mg/dL (ref 8.9–10.3)
Chloride: 102 mmol/L (ref 98–111)
Creatinine, Ser: 1.21 mg/dL — ABNORMAL HIGH (ref 0.44–1.00)
GFR calc Af Amer: 50 mL/min — ABNORMAL LOW (ref >60)
GFR calc non Af Amer: 43 mL/min — ABNORMAL LOW (ref >60)
Glucose, Bld: 120 mg/dL — ABNORMAL HIGH (ref 70–99)
Potassium: 3.3 mmol/L — ABNORMAL LOW (ref 3.5–5.1)
Sodium: 136 mmol/L (ref 135–145)

## 2018-01-20 LAB — CBC WITH DIFFERENTIAL/PLATELET
Abs Immature Granulocytes: 0.02 10*3/uL (ref 0.00–0.07)
Basophils Absolute: 0 10*3/uL (ref 0.0–0.1)
Basophils Relative: 0 %
Eosinophils Absolute: 0 10*3/uL (ref 0.0–0.5)
Eosinophils Relative: 0 %
HCT: 34.6 % — ABNORMAL LOW (ref 36.0–46.0)
Hemoglobin: 11.3 g/dL — ABNORMAL LOW (ref 12.0–15.0)
Immature Granulocytes: 0 %
Lymphocytes Relative: 14 %
Lymphs Abs: 1.2 10*3/uL (ref 0.7–4.0)
MCH: 31.3 pg (ref 26.0–34.0)
MCHC: 32.7 g/dL (ref 30.0–36.0)
MCV: 95.8 fL (ref 80.0–100.0)
Monocytes Absolute: 1.1 10*3/uL — ABNORMAL HIGH (ref 0.1–1.0)
Monocytes Relative: 13 %
Neutro Abs: 6.3 10*3/uL (ref 1.7–7.7)
Neutrophils Relative %: 73 %
Platelets: 228 10*3/uL (ref 150–400)
RBC: 3.61 MIL/uL — ABNORMAL LOW (ref 3.87–5.11)
RDW: 12.9 % (ref 11.5–15.5)
WBC: 8.6 10*3/uL (ref 4.0–10.5)
nRBC: 0 % (ref 0.0–0.2)

## 2018-01-20 LAB — URINALYSIS, ROUTINE W REFLEX MICROSCOPIC
Bilirubin Urine: NEGATIVE
Glucose, UA: NEGATIVE mg/dL
Hgb urine dipstick: NEGATIVE
Ketones, ur: NEGATIVE mg/dL
Leukocytes, UA: NEGATIVE
Nitrite: NEGATIVE
Protein, ur: NEGATIVE mg/dL
Specific Gravity, Urine: 1.005 (ref 1.005–1.030)
pH: 7 (ref 5.0–8.0)

## 2018-01-20 MED ORDER — ACETAMINOPHEN 325 MG PO TABS
650.0000 mg | ORAL_TABLET | Freq: Once | ORAL | Status: AC
  Administered 2018-01-20: 650 mg via ORAL
  Filled 2018-01-20: qty 2

## 2018-01-20 MED ORDER — LACTATED RINGERS IV BOLUS
500.0000 mL | Freq: Once | INTRAVENOUS | Status: AC
  Administered 2018-01-20: 500 mL via INTRAVENOUS

## 2018-01-20 NOTE — ED Notes (Signed)
Patient transported to X-ray 

## 2018-01-20 NOTE — ED Triage Notes (Addendum)
Pt with c/o dizziness since this afternoon and some balance problems. Pt very vague with symptoms. States she has had a runny nose and cough. Pt with fever of 101.6 in triage.

## 2018-02-01 NOTE — ED Provider Notes (Signed)
[facility] EMERGENCY DEPARTMENT Provider Note   CSN: [id number] Arrival date & time: 01/20/18  2137     History   Chief Complaint Chief Complaint  Patient presents with  . Dizziness    HPI Marisa Graham is a 77 y.o. female.  HPI   77 year old female with a vague sense of generally not faring well.  She feels dizzy but has a hard time characterizing this further.  Says she feels weak.  She has had a runny nose and little bit of a cough.  Noted to be febrile on arrival although she is not specifically felt like she had a fever.  No urinary complaints.  No acute respiratory complaints aside from the cough.  Denies any acute pain.  No acute rash.  Past Medical History:  Diagnosis Date  . Hyperlipidemia   . Hypertension     Patient Active Problem List   Diagnosis Date Noted  . Special screening for malignant neoplasms, colon     Past Surgical History:  Procedure Laterality Date  . COLONOSCOPY N/A 09/21/2014   Procedure: COLONOSCOPY;  Surgeon: Danie Binder, MD;  Location: [facility];  Service: Endoscopy;  Laterality: N/A;  2:00     OB History   No obstetric history on file.      Home Medications    Prior to Admission medications   Medication Sig Start Date End Date Taking? Authorizing Provider  amLODipine (NORVASC) 5 MG tablet Take 5 mg by mouth daily.   Yes [provider]  atorvastatin (LIPITOR) 40 MG tablet Take 40 mg by mouth daily.   Yes [provider]  lisinopril-hydrochlorothiazide (PRINZIDE,ZESTORETIC) 20-12.5 MG per tablet Take 1 tablet by mouth daily.   Yes [provider]  Multiple Vitamin (MULTIVITAMIN) tablet Take 1 tablet by mouth daily.   Yes [provider]  Vitamin D, Ergocalciferol, (DRISDOL) 50000 UNITS CAPS capsule Take 50,000 Units by mouth every 7 (seven) days.   Yes [provider]  pramoxine-hydrocortisone (PROCTOCREAM-HC) 1-1 % rectal cream USE PR 4 TIMES A DAY FOR 10 DAYS. 09/21/14    Danie Binder, MD    Family History No family history on file.  Social History Social History   Tobacco Use  . Smoking status: Former Smoker    Last attempt to quit: 09/20/1984    Years since quitting: 33.3  . Smokeless tobacco: Never Used  Substance Use Topics  . Alcohol use: No  . Drug use: No     Allergies   Patient has no known allergies.   Review of Systems Review of Systems  All systems reviewed and negative, other than as noted in HPI.  Physical Exam Updated Vital Signs BP (!) 132/56   Pulse 79   Temp (!) 101.6 F (38.7 C) (Oral)   Resp 18   Ht 5\' 2"  (1.575 m)   Wt 54.9 kg   SpO2 100%   BMI 22.13 kg/m   Physical Exam Vitals signs and nursing note reviewed.  Constitutional:      General: She is not in acute distress.    Appearance: She is well-developed.  HENT:     Head: Normocephalic and atraumatic.  Eyes:     General:        Right eye: No discharge.        Left eye: No discharge.     Conjunctiva/sclera: Conjunctivae normal.  Neck:     Musculoskeletal: Neck supple.  Cardiovascular:     Rate and Rhythm: Normal rate  and regular rhythm.     Heart sounds: Normal heart sounds. No murmur. No friction rub. No gallop.   Pulmonary:     Effort: Pulmonary effort is normal. No respiratory distress.     Breath sounds: Normal breath sounds.  Abdominal:     General: There is no distension.     Palpations: Abdomen is soft.     Tenderness: There is no abdominal tenderness.  Musculoskeletal:        General: No tenderness.  Skin:    General: Skin is warm and dry.  Neurological:     Mental Status: She is alert.  Psychiatric:        Behavior: Behavior normal.        Thought Content: Thought content normal.      ED Treatments / Results  Labs (all labs ordered are listed, but only abnormal results are displayed) Labs Reviewed  CBC WITH DIFFERENTIAL/PLATELET - Abnormal; Notable for the following components:      Result Value   RBC 3.61 (*)     Hemoglobin 11.3 (*)    HCT 34.6 (*)    Monocytes Absolute 1.1 (*)    All other components within normal limits  BASIC METABOLIC PANEL - Abnormal; Notable for the following components:   Potassium 3.3 (*)    Glucose, Bld 120 (*)    Creatinine, Ser 1.21 (*)    GFR calc non Af Amer 43 (*)    GFR calc Af Amer 50 (*)    All other components within normal limits  URINALYSIS, ROUTINE W REFLEX MICROSCOPIC - Abnormal; Notable for the following components:   Color, Urine STRAW (*)    All other components within normal limits    EKG None  Radiology No results found.   Dg Chest 2 View  Result Date: 01/20/2018 CLINICAL DATA:  Fever. Dizziness. EXAM: CHEST - 2 VIEW COMPARISON:  None. FINDINGS: The cardiomediastinal contours are normal. The lungs are clear. Pulmonary vasculature is normal. No consolidation, pleural effusion, or pneumothorax. No acute osseous abnormalities are seen. IMPRESSION: No acute pulmonary process. Electronically Signed   By: Keith Rake M.D.   On: 01/20/2018 23:02    Procedures Procedures (including critical care time)  Medications Ordered in ED Medications  acetaminophen (TYLENOL) tablet 650 mg (650 mg Oral Given 01/20/18 2210)  lactated ringers bolus 500 mL (0 mLs Intravenous Stopped 01/20/18 2236)     Initial Impression / Assessment and Plan / ED Course  I have reviewed the triage vital signs and the nursing notes.  Pertinent labs & imaging results that were available during my care of the patient were reviewed by me and considered in my medical decision making (see chart for details).     77 year old female with a vague sense of just not feeling well.  Likely from febrile illness. I'm not sure of the exact source of fever though.  She is nontoxic.  Exam is fairly reassuring.  ED work-up fairly unremarkable with regards to source of the infection.  Plan symptomatic treatment at this time.  Return precautions were discussed.  Final Clinical  Impressions(s) / ED Diagnoses   Final diagnoses:  Febrile illness, acute    ED Discharge Orders    None       Virgel Manifold, MD 02/01/18 1944

## 2018-02-18 ENCOUNTER — Ambulatory Visit (HOSPITAL_COMMUNITY)
Admission: RE | Admit: 2018-02-18 | Discharge: 2018-02-18 | Disposition: A | Discharge status: Home / Self Care | Payer: Medicare HMO | Source: Ambulatory Visit | Attending: Internal Medicine | Admitting: Internal Medicine

## 2018-02-18 DIAGNOSIS — Z1231 Encounter for screening mammogram for malignant neoplasm of breast: Secondary | ICD-10-CM | POA: Insufficient documentation

## 2018-03-13 DIAGNOSIS — G5762 Lesion of plantar nerve, left lower limb: Secondary | ICD-10-CM | POA: Diagnosis not present

## 2018-04-01 DIAGNOSIS — I10 Essential (primary) hypertension: Secondary | ICD-10-CM | POA: Diagnosis not present

## 2018-04-01 DIAGNOSIS — M1990 Unspecified osteoarthritis, unspecified site: Secondary | ICD-10-CM | POA: Diagnosis not present

## 2018-04-01 DIAGNOSIS — E785 Hyperlipidemia, unspecified: Secondary | ICD-10-CM | POA: Diagnosis not present

## 2018-06-13 DIAGNOSIS — E785 Hyperlipidemia, unspecified: Secondary | ICD-10-CM | POA: Diagnosis not present

## 2018-06-13 DIAGNOSIS — E559 Vitamin D deficiency, unspecified: Secondary | ICD-10-CM | POA: Diagnosis not present

## 2018-06-13 DIAGNOSIS — Z87891 Personal history of nicotine dependence: Secondary | ICD-10-CM | POA: Diagnosis not present

## 2018-06-13 DIAGNOSIS — I10 Essential (primary) hypertension: Secondary | ICD-10-CM | POA: Diagnosis not present

## 2018-07-02 DIAGNOSIS — Z1331 Encounter for screening for depression: Secondary | ICD-10-CM | POA: Diagnosis not present

## 2018-07-02 DIAGNOSIS — Z1389 Encounter for screening for other disorder: Secondary | ICD-10-CM | POA: Diagnosis not present

## 2018-07-02 DIAGNOSIS — Z0000 Encounter for general adult medical examination without abnormal findings: Secondary | ICD-10-CM | POA: Diagnosis not present

## 2018-07-02 DIAGNOSIS — M1990 Unspecified osteoarthritis, unspecified site: Secondary | ICD-10-CM | POA: Diagnosis not present

## 2018-07-02 DIAGNOSIS — I10 Essential (primary) hypertension: Secondary | ICD-10-CM | POA: Diagnosis not present

## 2018-07-02 DIAGNOSIS — E785 Hyperlipidemia, unspecified: Secondary | ICD-10-CM | POA: Diagnosis not present

## 2018-07-02 DIAGNOSIS — Z0001 Encounter for general adult medical examination with abnormal findings: Secondary | ICD-10-CM | POA: Diagnosis not present

## 2018-08-02 DIAGNOSIS — I10 Essential (primary) hypertension: Secondary | ICD-10-CM | POA: Diagnosis not present

## 2018-08-02 DIAGNOSIS — E785 Hyperlipidemia, unspecified: Secondary | ICD-10-CM | POA: Diagnosis not present

## 2018-09-01 DIAGNOSIS — E785 Hyperlipidemia, unspecified: Secondary | ICD-10-CM | POA: Diagnosis not present

## 2018-09-01 DIAGNOSIS — I10 Essential (primary) hypertension: Secondary | ICD-10-CM | POA: Diagnosis not present

## 2018-09-05 DIAGNOSIS — R0789 Other chest pain: Secondary | ICD-10-CM | POA: Diagnosis not present

## 2018-09-05 DIAGNOSIS — E785 Hyperlipidemia, unspecified: Secondary | ICD-10-CM | POA: Diagnosis not present

## 2018-09-05 DIAGNOSIS — I10 Essential (primary) hypertension: Secondary | ICD-10-CM | POA: Diagnosis not present

## 2018-10-06 DIAGNOSIS — I10 Essential (primary) hypertension: Secondary | ICD-10-CM | POA: Diagnosis not present

## 2018-10-06 DIAGNOSIS — E785 Hyperlipidemia, unspecified: Secondary | ICD-10-CM | POA: Diagnosis not present

## 2018-10-22 DIAGNOSIS — Z23 Encounter for immunization: Secondary | ICD-10-CM | POA: Diagnosis not present

## 2018-10-28 DIAGNOSIS — E785 Hyperlipidemia, unspecified: Secondary | ICD-10-CM | POA: Diagnosis not present

## 2018-10-28 DIAGNOSIS — M1990 Unspecified osteoarthritis, unspecified site: Secondary | ICD-10-CM | POA: Diagnosis not present

## 2018-11-01 DIAGNOSIS — Z0100 Encounter for examination of eyes and vision without abnormal findings: Secondary | ICD-10-CM | POA: Diagnosis not present

## 2018-11-01 DIAGNOSIS — H524 Presbyopia: Secondary | ICD-10-CM | POA: Diagnosis not present

## 2018-11-22 DIAGNOSIS — G5762 Lesion of plantar nerve, left lower limb: Secondary | ICD-10-CM | POA: Diagnosis not present

## 2018-11-27 DIAGNOSIS — I10 Essential (primary) hypertension: Secondary | ICD-10-CM | POA: Diagnosis not present

## 2018-11-27 DIAGNOSIS — E785 Hyperlipidemia, unspecified: Secondary | ICD-10-CM | POA: Diagnosis not present

## 2018-12-03 DIAGNOSIS — I10 Essential (primary) hypertension: Secondary | ICD-10-CM | POA: Diagnosis not present

## 2018-12-03 DIAGNOSIS — T465X5A Adverse effect of other antihypertensive drugs, initial encounter: Secondary | ICD-10-CM | POA: Diagnosis not present

## 2018-12-03 DIAGNOSIS — T783XXA Angioneurotic edema, initial encounter: Secondary | ICD-10-CM | POA: Diagnosis not present

## 2018-12-10 DIAGNOSIS — M1990 Unspecified osteoarthritis, unspecified site: Secondary | ICD-10-CM | POA: Diagnosis not present

## 2018-12-10 DIAGNOSIS — E785 Hyperlipidemia, unspecified: Secondary | ICD-10-CM | POA: Diagnosis not present

## 2018-12-10 DIAGNOSIS — I10 Essential (primary) hypertension: Secondary | ICD-10-CM | POA: Diagnosis not present

## 2019-01-09 DIAGNOSIS — E785 Hyperlipidemia, unspecified: Secondary | ICD-10-CM | POA: Diagnosis not present

## 2019-01-09 DIAGNOSIS — I10 Essential (primary) hypertension: Secondary | ICD-10-CM | POA: Diagnosis not present

## 2019-01-13 ENCOUNTER — Other Ambulatory Visit (HOSPITAL_COMMUNITY): Payer: Self-pay | Admitting: Internal Medicine

## 2019-01-13 DIAGNOSIS — Z1231 Encounter for screening mammogram for malignant neoplasm of breast: Secondary | ICD-10-CM

## 2019-02-09 DIAGNOSIS — I10 Essential (primary) hypertension: Secondary | ICD-10-CM | POA: Diagnosis not present

## 2019-02-09 DIAGNOSIS — E785 Hyperlipidemia, unspecified: Secondary | ICD-10-CM | POA: Diagnosis not present

## 2019-02-21 ENCOUNTER — Other Ambulatory Visit: Payer: Self-pay

## 2019-02-21 ENCOUNTER — Ambulatory Visit (HOSPITAL_COMMUNITY)
Admission: RE | Admit: 2019-02-21 | Discharge: 2019-02-21 | Disposition: A | Discharge status: Home / Self Care | Payer: Medicare HMO | Source: Ambulatory Visit | Attending: Internal Medicine | Admitting: Internal Medicine

## 2019-02-21 DIAGNOSIS — Z1231 Encounter for screening mammogram for malignant neoplasm of breast: Secondary | ICD-10-CM | POA: Insufficient documentation

## 2020-01-19 IMAGING — MG DIGITAL SCREENING BILATERAL MAMMOGRAM WITH TOMO AND CAD
8 series · 8 of 24 positions shown · non-contrast
Comparison: Previous exam(s).

CLINICAL DATA: Screening.

EXAM:
DIGITAL SCREENING BILATERAL MAMMOGRAM WITH TOMO AND CAD

[R MLO synth-2D]
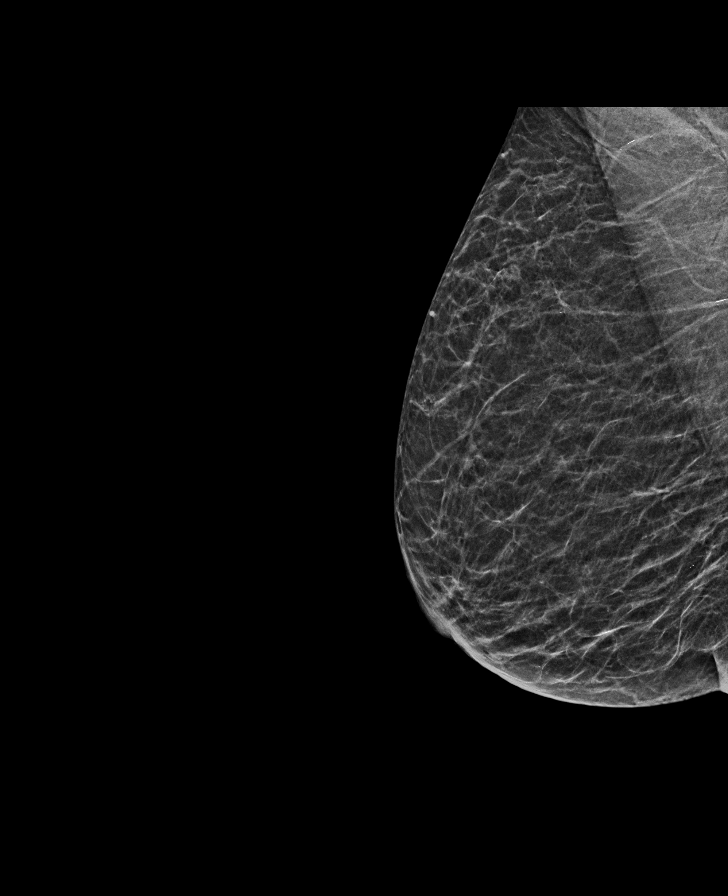

[R CC synth-2D]
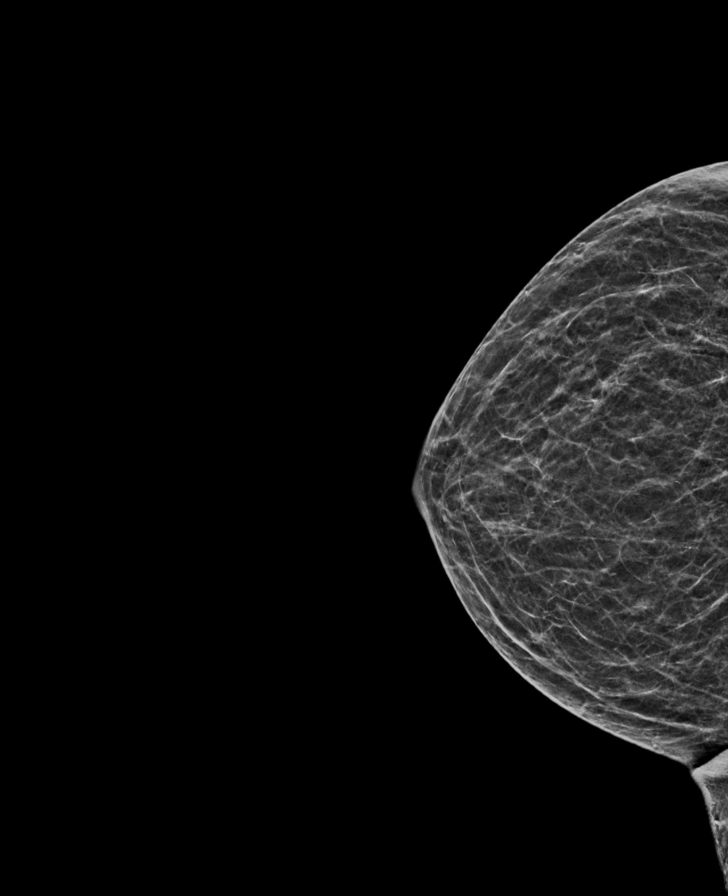

[L MLO synth-2D]
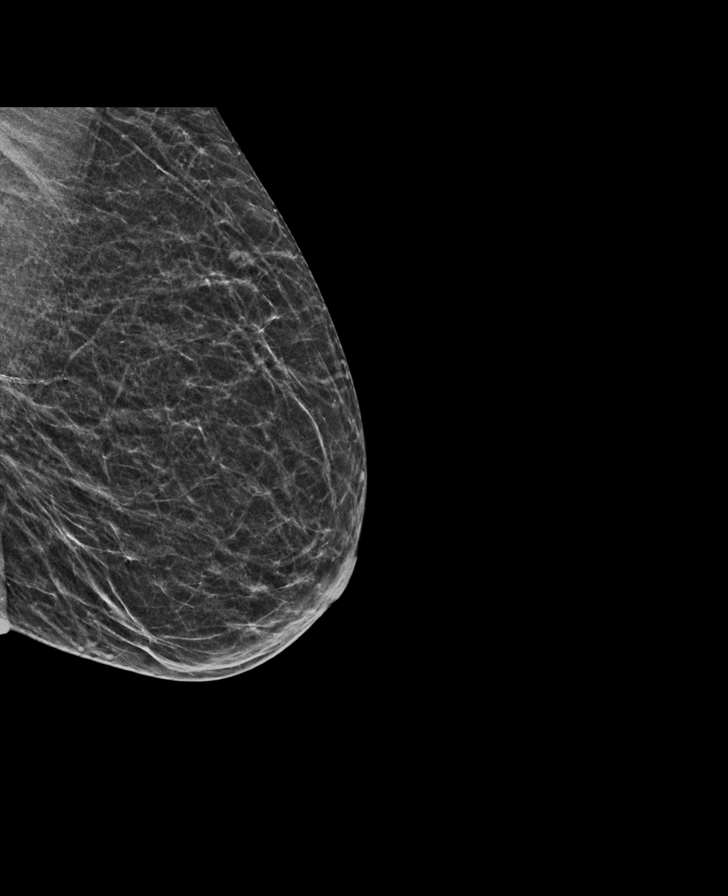

[L CC synth-2D]
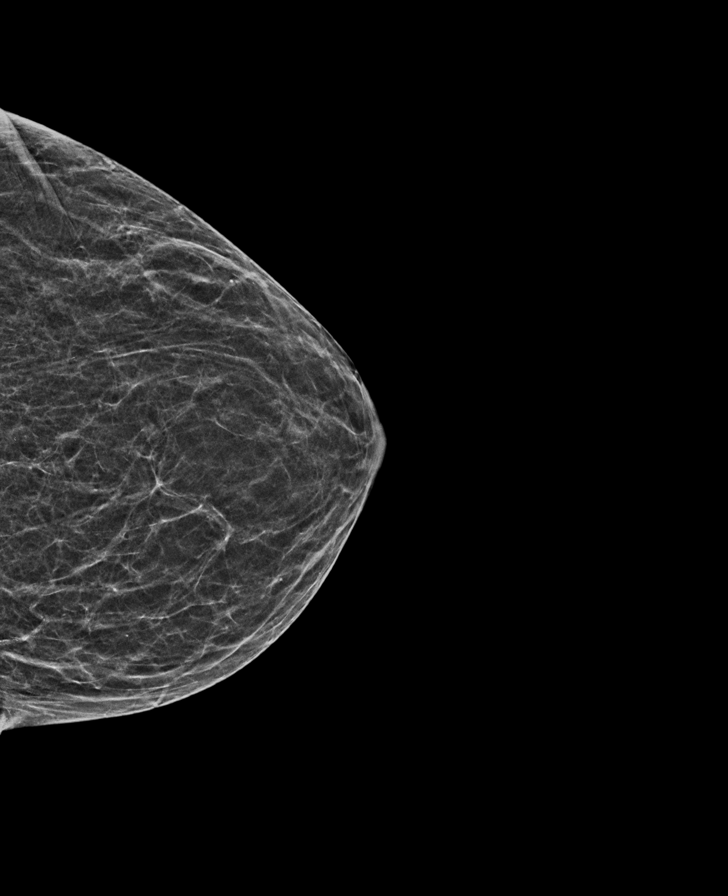

[R CC tomo · tomo slice 20/39.0]
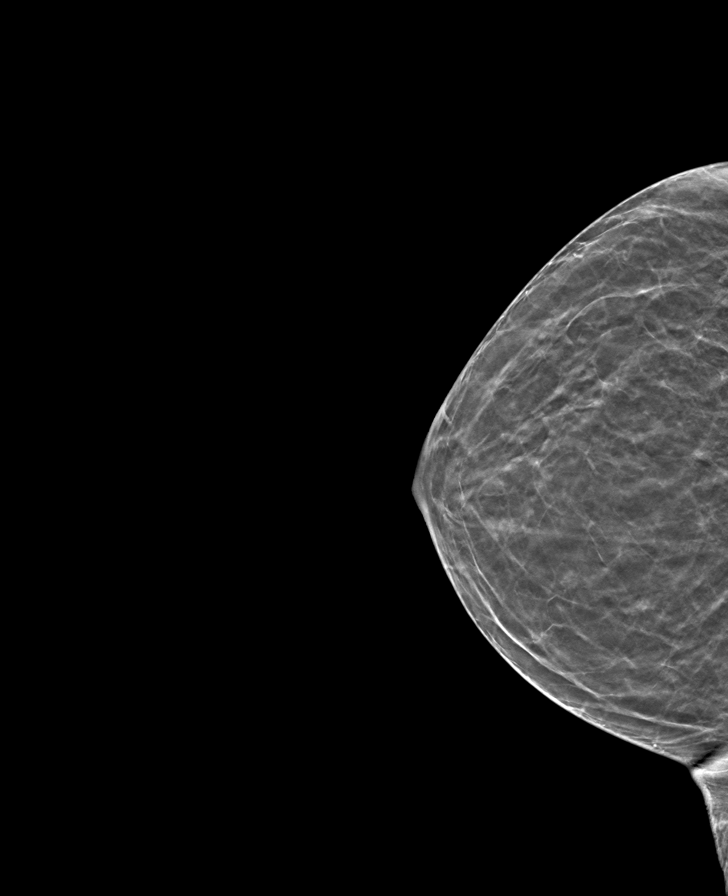

[L CC tomo · tomo slice 21/42.0]
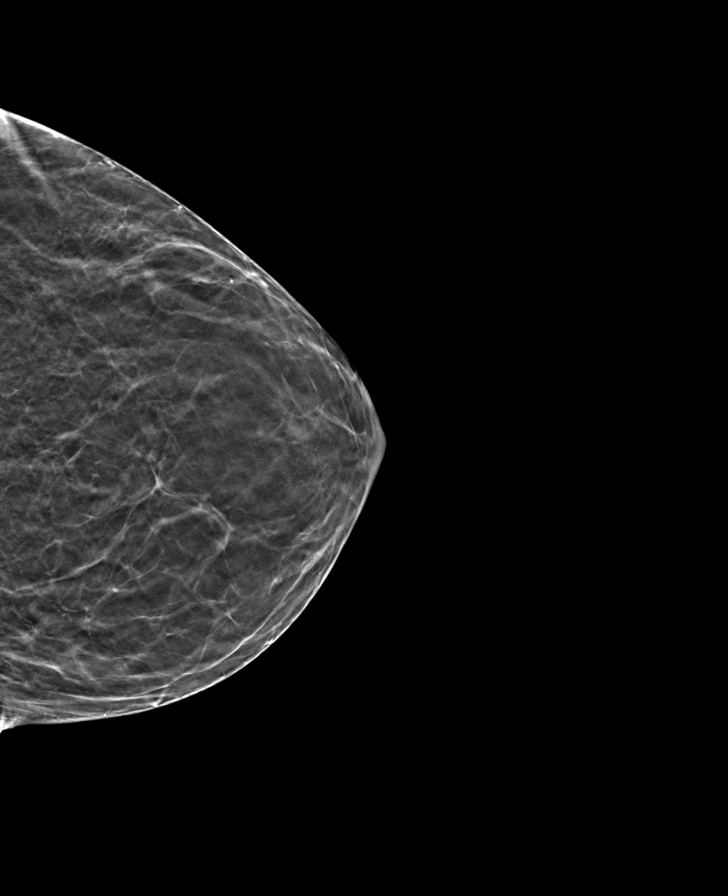

[R MLO tomo · tomo slice 22/43.0]
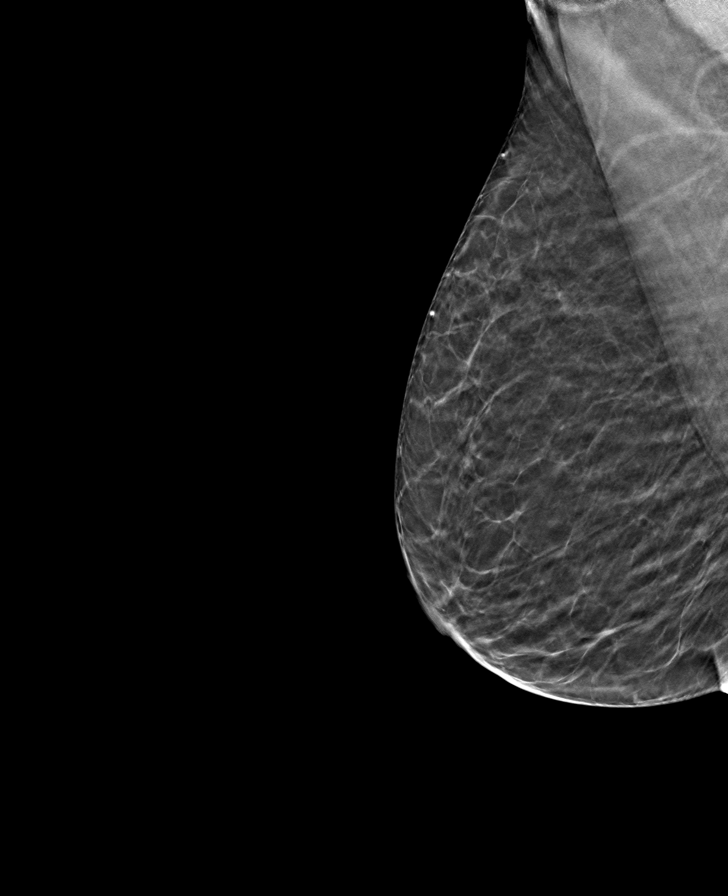

[L MLO tomo · tomo slice 23/45.0]
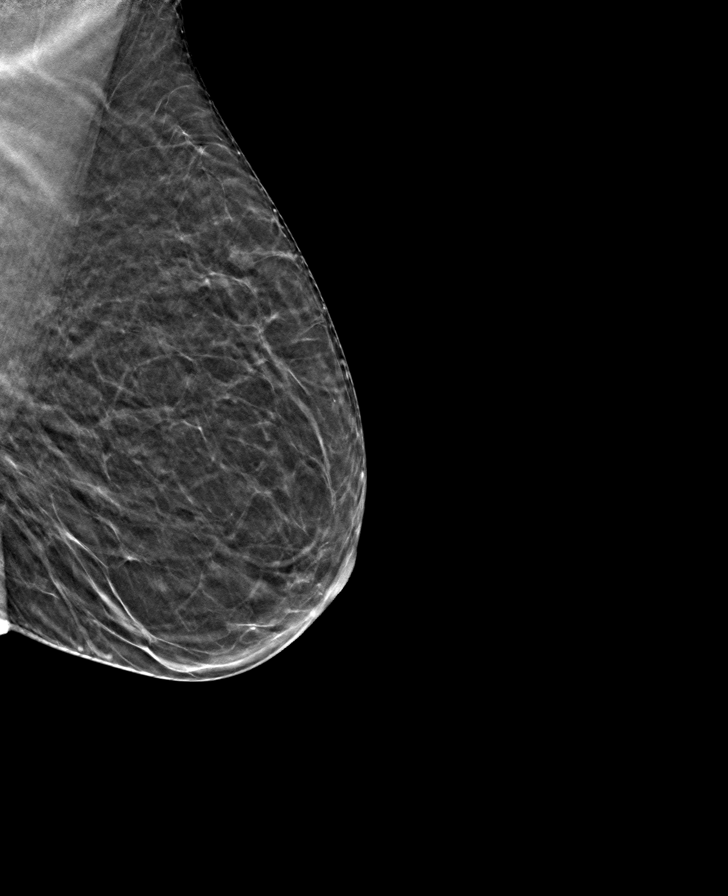

[8 of 24 positions shown; findings below may reference images not displayed]

ACR Breast Density Category b: There are scattered areas of
fibroglandular density.
FINDINGS: There are no findings suspicious for malignancy. Images were
processed with CAD.
IMPRESSION: No mammographic evidence of malignancy. A result letter of this
screening mammogram will be mailed directly to the patient.

RECOMMENDATION:
Screening mammogram in one year. (Code:[id number]-775)

BI-RADS CATEGORY  1: Negative.

## 2020-01-20 ENCOUNTER — Other Ambulatory Visit (HOSPITAL_COMMUNITY): Payer: Self-pay | Admitting: Internal Medicine

## 2020-01-20 DIAGNOSIS — Z1231 Encounter for screening mammogram for malignant neoplasm of breast: Secondary | ICD-10-CM

## 2020-02-25 ENCOUNTER — Other Ambulatory Visit: Payer: Self-pay

## 2020-02-25 ENCOUNTER — Ambulatory Visit (HOSPITAL_COMMUNITY)
Admission: RE | Admit: 2020-02-25 | Discharge: 2020-02-25 | Disposition: A | Discharge status: Home / Self Care | Payer: Medicare HMO | Source: Ambulatory Visit | Attending: Internal Medicine | Admitting: Internal Medicine

## 2020-02-25 DIAGNOSIS — Z1231 Encounter for screening mammogram for malignant neoplasm of breast: Secondary | ICD-10-CM | POA: Insufficient documentation

## 2021-01-17 ENCOUNTER — Other Ambulatory Visit (HOSPITAL_COMMUNITY): Payer: Self-pay | Admitting: Internal Medicine

## 2021-01-17 DIAGNOSIS — Z1231 Encounter for screening mammogram for malignant neoplasm of breast: Secondary | ICD-10-CM

## 2021-02-28 ENCOUNTER — Other Ambulatory Visit: Payer: Self-pay

## 2021-02-28 ENCOUNTER — Ambulatory Visit (HOSPITAL_COMMUNITY)
Admission: RE | Admit: 2021-02-28 | Discharge: 2021-02-28 | Disposition: A | Discharge status: Home / Self Care | Payer: Medicare HMO | Source: Ambulatory Visit | Attending: Internal Medicine | Admitting: Internal Medicine

## 2021-02-28 DIAGNOSIS — Z1231 Encounter for screening mammogram for malignant neoplasm of breast: Secondary | ICD-10-CM | POA: Diagnosis present

## 2021-03-07 ENCOUNTER — Other Ambulatory Visit (HOSPITAL_COMMUNITY): Payer: Self-pay | Admitting: Gerontology

## 2021-03-07 ENCOUNTER — Other Ambulatory Visit: Payer: Self-pay | Admitting: Gerontology

## 2021-03-07 DIAGNOSIS — M79605 Pain in left leg: Secondary | ICD-10-CM

## 2021-03-07 DIAGNOSIS — M79652 Pain in left thigh: Secondary | ICD-10-CM

## 2021-03-08 ENCOUNTER — Other Ambulatory Visit: Payer: Self-pay

## 2021-03-08 ENCOUNTER — Ambulatory Visit (HOSPITAL_COMMUNITY)
Admission: RE | Admit: 2021-03-08 | Discharge: 2021-03-08 | Disposition: A | Discharge status: Home / Self Care | Payer: Medicare HMO | Source: Ambulatory Visit | Attending: Gerontology | Admitting: Gerontology

## 2021-03-08 DIAGNOSIS — M79605 Pain in left leg: Secondary | ICD-10-CM | POA: Diagnosis present

## 2021-03-08 DIAGNOSIS — M79652 Pain in left thigh: Secondary | ICD-10-CM | POA: Diagnosis present

## 2022-02-03 ENCOUNTER — Other Ambulatory Visit (HOSPITAL_COMMUNITY): Payer: Self-pay | Admitting: Internal Medicine

## 2022-02-03 DIAGNOSIS — Z1231 Encounter for screening mammogram for malignant neoplasm of breast: Secondary | ICD-10-CM

## 2022-03-02 ENCOUNTER — Ambulatory Visit (HOSPITAL_COMMUNITY): Payer: Medicare HMO

## 2022-03-06 ENCOUNTER — Ambulatory Visit (HOSPITAL_COMMUNITY): Payer: Medicare HMO

## 2022-03-08 ENCOUNTER — Ambulatory Visit (HOSPITAL_COMMUNITY)
Admission: RE | Admit: 2022-03-08 | Discharge: 2022-03-08 | Disposition: A | Discharge status: Home / Self Care | Payer: Medicare HMO | Source: Ambulatory Visit | Attending: Internal Medicine | Admitting: Internal Medicine

## 2022-03-08 DIAGNOSIS — Z1231 Encounter for screening mammogram for malignant neoplasm of breast: Secondary | ICD-10-CM | POA: Diagnosis present

## 2022-07-05 ENCOUNTER — Ambulatory Visit (INDEPENDENT_AMBULATORY_CARE_PROVIDER_SITE_OTHER): Payer: Medicare HMO

## 2022-07-05 ENCOUNTER — Ambulatory Visit
Admission: EM | Admit: 2022-07-05 | Discharge: 2022-07-05 | Disposition: A | Discharge status: Home / Self Care | Payer: Medicare HMO | Attending: Nurse Practitioner | Admitting: Nurse Practitioner

## 2022-07-05 DIAGNOSIS — R1030 Lower abdominal pain, unspecified: Secondary | ICD-10-CM

## 2022-07-05 DIAGNOSIS — D259 Leiomyoma of uterus, unspecified: Secondary | ICD-10-CM | POA: Diagnosis not present

## 2022-07-05 LAB — POCT URINALYSIS DIP (MANUAL ENTRY)
Bilirubin, UA: NEGATIVE
Blood, UA: NEGATIVE
Glucose, UA: NEGATIVE mg/dL
Ketones, POC UA: NEGATIVE mg/dL
Leukocytes, UA: NEGATIVE
Nitrite, UA: NEGATIVE
Spec Grav, UA: 1.02 (ref 1.010–1.025)
Urobilinogen, UA: 1 E.U./dL
pH, UA: 7 (ref 5.0–8.0)

## 2022-07-05 NOTE — ED Provider Notes (Signed)
[facility]    CSN: [id number] Arrival date & time: 07/05/22  1331      History   Chief Complaint Chief Complaint  Patient presents with   Abdominal Pain    HPI Marisa Graham is a 82 y.o. female.   Patient presents today with 3 days of lower abdominal pain.  Reports the pain is constant, severe, and describes the pain as "dull."  Patient denies radiation of pain to elsewhere in the abdomen or down the leg.  Patient denies body aches, chills, known fevers, diarrhea, or constipation, blood in the stool, heartburn/indigestion, dysuria/urinary frequency, foul urinary odor, and hematuria.  Reports she had 1 episode of vomiting yesterday and has had decreased appetite and nausea today.  She has been able to drink plenty of water today and keep it down, however.  Has not taken anything for symptoms so far.  Reports she typically has a bowel movement every day for Singh in the morning, does not usually have to strain to start the bowel movement sometimes does feel like she does not completely empty her bowels.    Past Medical History:  Diagnosis Date   Hyperlipidemia    Hypertension     Patient Active Problem List   Diagnosis Date Noted   Special screening for malignant neoplasms, colon     Past Surgical History:  Procedure Laterality Date   COLONOSCOPY N/A 09/21/2014   Procedure: COLONOSCOPY;  Surgeon: West Bali, MD;  Location: [facility];  Service: Endoscopy;  Laterality: N/A;  2:00    OB History   No obstetric history on file.      Home Medications    Prior to Admission medications   Medication Sig Start Date End Date Taking? Authorizing Provider  amLODipine (NORVASC) 5 MG tablet Take 5 mg by mouth daily.   Yes [provider]  atorvastatin (LIPITOR) 40 MG tablet Take 40 mg by mouth daily.   Yes [provider]  lisinopril-hydrochlorothiazide (PRINZIDE,ZESTORETIC) 20-12.5 MG per tablet Take 1 tablet by mouth daily.   Yes  [provider]  Multiple Vitamin (MULTIVITAMIN) tablet Take 1 tablet by mouth daily.   Yes [provider]  Vitamin D, Ergocalciferol, (DRISDOL) 50000 UNITS CAPS capsule Take 50,000 Units by mouth every 7 (seven) days.   Yes [provider]  pramoxine-hydrocortisone (PROCTOCREAM-HC) 1-1 % rectal cream USE PR 4 TIMES A DAY FOR 10 DAYS. 09/21/14   West Bali, MD    Family History History reviewed. No pertinent family history.  Social History Social History   Tobacco Use   Smoking status: Former    Types: Cigarettes    Quit date: 09/20/1984    Years since quitting: 37.8   Smokeless tobacco: Never  Substance Use Topics   Alcohol use: No   Drug use: No     Allergies   Patient has no known allergies.   Review of Systems Review of Systems Per HPI  Physical Exam Triage Vital Signs ED Triage Vitals  Enc Vitals Group     BP 07/05/22 1350 (!) 171/85     Pulse Rate 07/05/22 1347 84     Resp 07/05/22 1347 16     Temp 07/05/22 1347 (!) 100.4 F (38 C)     Temp Source 07/05/22 1347 Oral     SpO2 07/05/22 1347 95 %     Weight --      Height --      Head Circumference --  Peak Flow --      Pain Score 07/05/22 1347 9     Pain Loc --      Pain Edu? --      Excl. in GC? --    No data found.  Updated Vital Signs BP (!) 171/85   Pulse 84   Temp (!) 100.4 F (38 C) (Oral)   Resp 16   SpO2 95%   Visual Acuity Right Eye Distance:   Left Eye Distance:   Bilateral Distance:    Right Eye Near:   Left Eye Near:    Bilateral Near:     Physical Exam Vitals and nursing note reviewed.  Constitutional:      General: She is not in acute distress.    Appearance: Normal appearance. She is not toxic-appearing.  HENT:     Head: Normocephalic and atraumatic.     Mouth/Throat:     Mouth: Mucous membranes are moist.     Pharynx: Oropharynx is clear.  Eyes:     General: No scleral icterus.    Extraocular Movements: Extraocular movements  intact.  Cardiovascular:     Rate and Rhythm: Regular rhythm. Tachycardia present.  Pulmonary:     Effort: Pulmonary effort is normal. No respiratory distress.     Breath sounds: Normal breath sounds. No wheezing, rhonchi or rales.  Abdominal:     General: Abdomen is flat. Bowel sounds are normal. There is no distension.     Palpations: Abdomen is soft.     Tenderness: There is no abdominal tenderness. There is no right CVA tenderness, left CVA tenderness, guarding or rebound. Negative signs include Murphy's sign, Rovsing's sign and McBurney's sign.       Comments: Tenderness over the symphysis pubis, no mass, erythema, skin discoloration appreciated.  Musculoskeletal:     Cervical back: Normal range of motion.  Lymphadenopathy:     Cervical: No cervical adenopathy.  Skin:    General: Skin is warm and dry.     Capillary Refill: Capillary refill takes less than 2 seconds.     Coloration: Skin is not jaundiced or pale.     Findings: No erythema.  Neurological:     Mental Status: She is alert and oriented to person, place, and time.  Psychiatric:        Mood and Affect: Mood is anxious.        Behavior: Behavior is cooperative.      UC Treatments / Results  Labs (all labs ordered are listed, but only abnormal results are displayed) Labs Reviewed  POCT URINALYSIS DIP (MANUAL ENTRY) - Abnormal; Notable for the following components:      Result Value   Clarity, UA hazy (*)    Protein Ur, POC trace (*)    All other components within normal limits    EKG   Radiology DG Abd 1 View  Result Date: 07/05/2022 CLINICAL DATA:  Abdominal pain EXAM: ABDOMEN - 1 VIEW COMPARISON:  None Available. FINDINGS: Nonobstructive bowel-gas pattern. Moderate stool burden. Pelvic calcifications which are likely due to fibroids. Visualized lungs are clear. IMPRESSION: Nonobstructive bowel-gas pattern. Electronically Signed   By: Allegra Lai M.D.   On: 07/05/2022 14:36     Procedures Procedures (including critical care time)  Medications Ordered in UC Medications - No data to display  Initial Impression / Assessment and Plan / UC Course  I have reviewed the triage vital signs and the nursing notes.  Pertinent labs & imaging results that were available during  my care of the patient were reviewed by me and considered in my medical decision making (see chart for details).   Patient is well-appearing, normotensive, afebrile, not tachycardic, not tachypneic, oxygenating well on room air.    1. Lower abdominal pain 2. Uterine leiomyoma, unspecified location Abdominal x-ray today shows moderate stool burden, pelvic calcifications which are likely fibroids Pelvic calcification last seen on x-ray imaging of left hip in 2018 and 2012 which showed uterine fibroid Discussed findings with patient Recommended MiraLAX to help loosen stool; suspect some possible hard stool pushing against fibroid possibly causing pain Start Tylenol 500 mg every 6 hours as needed along with warm compresses Follow-up with OB/GYN and contact information given  The patient was given the opportunity to ask questions.  All questions answered to their satisfaction.  The patient is in agreement to this plan.    Final Clinical Impressions(s) / UC Diagnoses   Final diagnoses:  Lower abdominal pain  Uterine leiomyoma, unspecified location     Discharge Instructions      The abdominal xray today shows multiple uterine fibroids.  It also shows there is a little bit of stool in your colon.  This could be pushing out on the fibroid causing the pain.  Start Miralax 17 g daily to help loosen your stools.  You can also take Tylenol 500 mg every 6 hours as needed for pain.  Use warm compresses applied to the skin to help with abdominal pain.  Follow up with OB/GYN with no improvement in symptoms despite treatment.     ED Prescriptions   None    PDMP not reviewed this encounter.    Valentino Nose, NP 07/05/22 1530

## 2022-07-05 NOTE — Discharge Instructions (Signed)
The abdominal xray today shows multiple uterine fibroids.  It also shows there is a little bit of stool in your colon.  This could be pushing out on the fibroid causing the pain.  Start Miralax 17 g daily to help loosen your stools.  You can also take Tylenol 500 mg every 6 hours as needed for pain.  Use warm compresses applied to the skin to help with abdominal pain.  Follow up with OB/GYN with no improvement in symptoms despite treatment.

## 2022-07-05 NOTE — ED Triage Notes (Signed)
Pt presents with lower abdominal pain that started Sunday. Pt temperature is 100.4 today but has no symptoms of body aches of chills.

## 2022-07-09 ENCOUNTER — Ambulatory Visit
Admission: EM | Admit: 2022-07-09 | Discharge: 2022-07-09 | Disposition: A | Discharge status: Home / Self Care | Payer: Medicare HMO | Attending: Nurse Practitioner | Admitting: Nurse Practitioner

## 2022-07-09 ENCOUNTER — Ambulatory Visit (INDEPENDENT_AMBULATORY_CARE_PROVIDER_SITE_OTHER): Payer: Medicare HMO

## 2022-07-09 DIAGNOSIS — S8002XA Contusion of left knee, initial encounter: Secondary | ICD-10-CM

## 2022-07-09 NOTE — ED Triage Notes (Signed)
Pt reports she has left knee pain due to a fall x 2 days ago. States she hit her knee on the ground trying to get into bed she slipped on carpet.

## 2022-07-09 NOTE — Discharge Instructions (Addendum)
The x-ray today does not show any broken bones.  Please wear the Ace wrap when you are walking to help provide compression and help with swelling and pain.  When sitting down, elevate your left leg and apply ice 15 minutes on, 45 minutes off every hour while awake.  You can take Tylenol 500 to 1000 mg every 6 hours as needed for pain.  Seek care with Orthopedic provider for persistent or worsening symptoms despite treatment.

## 2022-07-09 NOTE — ED Provider Notes (Signed)
[facility]    CSN: [id number] Arrival date & time: 07/09/22  0914      History   Chief Complaint Chief Complaint  Patient presents with   Fall    HPI Marisa Graham is a 82 y.o. female.   Patient presents today for left knee pain that began after falling when she was attempting to get out of bed 2 days ago.  Reports pain is worse when she touches her knee or when she walks.  She denies weakness with weightbearing or walking, sensation of giving way, locking/popping of the knee.  She reports the knee is little bit bruised, and swollen however no redness, warmth.  No fevers or nausea/vomiting since pain began.  No numbness or tingling in the toes of the left knee.    Past Medical History:  Diagnosis Date   Hyperlipidemia    Hypertension     Patient Active Problem List   Diagnosis Date Noted   Special screening for malignant neoplasms, colon     Past Surgical History:  Procedure Laterality Date   COLONOSCOPY N/A 09/21/2014   Procedure: COLONOSCOPY;  Surgeon: West Bali, MD;  Location: [facility];  Service: Endoscopy;  Laterality: N/A;  2:00    OB History   No obstetric history on file.      Home Medications    Prior to Admission medications   Medication Sig Start Date End Date Taking? Authorizing Provider  amLODipine (NORVASC) 5 MG tablet Take 5 mg by mouth daily.    [provider]  atorvastatin (LIPITOR) 40 MG tablet Take 40 mg by mouth daily.    [provider]  lisinopril-hydrochlorothiazide (PRINZIDE,ZESTORETIC) 20-12.5 MG per tablet Take 1 tablet by mouth daily.    [provider]  Multiple Vitamin (MULTIVITAMIN) tablet Take 1 tablet by mouth daily.    [provider]  pramoxine-hydrocortisone (PROCTOCREAM-HC) 1-1 % rectal cream USE PR 4 TIMES A DAY FOR 10 DAYS. 09/21/14   Fields, Darleene Cleaver, MD  Vitamin D, Ergocalciferol, (DRISDOL) 50000 UNITS CAPS capsule Take 50,000 Units by mouth every 7 (seven) days.     [provider]    Family History History reviewed. No pertinent family history.  Social History Social History   Tobacco Use   Smoking status: Former    Types: Cigarettes    Quit date: 09/20/1984    Years since quitting: 37.8   Smokeless tobacco: Never  Substance Use Topics   Alcohol use: No   Drug use: No     Allergies   Patient has no known allergies.   Review of Systems Review of Systems Per HPI  Physical Exam Triage Vital Signs ED Triage Vitals  Enc Vitals Group     BP 07/09/22 1015 (!) 147/74     Pulse Rate 07/09/22 1015 73     Resp 07/09/22 1015 20     Temp 07/09/22 1015 98.1 F (36.7 C)     Temp Source 07/09/22 1015 Oral     SpO2 07/09/22 1015 95 %     Weight --      Height --      Head Circumference --      Peak Flow --      Pain Score 07/09/22 1017 9     Pain Loc --      Pain Edu? --      Excl. in GC? --    No data found.  Updated Vital Signs BP (!) 147/74 (BP Location: Right  Arm)   Pulse 73   Temp 98.1 F (36.7 C) (Oral)   Resp 20   SpO2 95%   Visual Acuity Right Eye Distance:   Left Eye Distance:   Bilateral Distance:    Right Eye Near:   Left Eye Near:    Bilateral Near:     Physical Exam Vitals and nursing note reviewed.  Constitutional:      General: She is not in acute distress.    Appearance: Normal appearance. She is not toxic-appearing.  HENT:     Mouth/Throat:     Mouth: Mucous membranes are moist.     Pharynx: Oropharynx is clear.  Pulmonary:     Effort: Pulmonary effort is normal. No respiratory distress.  Musculoskeletal:     Right knee: Normal. No swelling. Normal pulse.     Left knee: Ecchymosis and bony tenderness present. No swelling, deformity, effusion or erythema. Normal range of motion. No tenderness. Normal alignment and normal patellar mobility. Normal pulse.     Comments: Inspection: Bruising and mild swelling to patella; no obvious deformity or redness Palpation: Left patella tender to  palpation; no obvious deformities palpated ROM: Full ROM to left knee; no laxity appreciated Strength: 5/5 bilateral lower extremities Neurovascular: neurovascularly intact in left and right lower extremity   Skin:    General: Skin is warm and dry.     Capillary Refill: Capillary refill takes less than 2 seconds.     Coloration: Skin is not jaundiced or pale.     Findings: No erythema.  Neurological:     Mental Status: She is alert and oriented to person, place, and time.  Psychiatric:        Behavior: Behavior is cooperative.      UC Treatments / Results  Labs (all labs ordered are listed, but only abnormal results are displayed) Labs Reviewed - No data to display  EKG   Radiology DG Knee Complete 4 Views Left  Result Date: 07/09/2022 CLINICAL DATA:  Larey Seat getting into bed.  Left knee pain. EXAM: LEFT KNEE - COMPLETE 4+ VIEW COMPARISON:  None Available. FINDINGS: No evidence of fracture, dislocation, or joint effusion. No evidence of arthropathy or other focal bone abnormality. Soft tissues are unremarkable. IMPRESSION: Negative. Electronically Signed   By: Amie Portland M.D.   On: 07/09/2022 10:36    Procedures Procedures (including critical care time)  Medications Ordered in UC Medications - No data to display  Initial Impression / Assessment and Plan / UC Course  I have reviewed the triage vital signs and the nursing notes.  Pertinent labs & imaging results that were available during my care of the patient were reviewed by me and considered in my medical decision making (see chart for details).   Patient is well-appearing, normotensive, afebrile, not tachycardic, not tachypneic, oxygenating well on room air.    1. Contusion of left knee, initial encounter X-ray imaging today is negative for acute bony abnormality Suspect contusion of patella Treat with rest, ice, compression, elevation Tylenol as needed for pain Seek care for persistent or worsening symptoms despite  treatment  The patient was given the opportunity to ask questions.  All questions answered to their satisfaction.  The patient is in agreement to this plan.    Final Clinical Impressions(s) / UC Diagnoses   Final diagnoses:  Contusion of left knee, initial encounter     Discharge Instructions      The x-ray today does not show any broken bones.  Please wear  the Ace wrap when you are walking to help provide compression and help with swelling and pain.  When sitting down, elevate your left leg and apply ice 15 minutes on, 45 minutes off every hour while awake.  You can take Tylenol 500 to 1000 mg every 6 hours as needed for pain.  Seek care with Orthopedic provider for persistent or worsening symptoms despite treatment.    ED Prescriptions   None    PDMP not reviewed this encounter.   Valentino Nose, NP 07/09/22 1120

## 2023-02-16 ENCOUNTER — Other Ambulatory Visit (HOSPITAL_COMMUNITY): Payer: Self-pay | Admitting: Internal Medicine

## 2023-02-16 DIAGNOSIS — Z1231 Encounter for screening mammogram for malignant neoplasm of breast: Secondary | ICD-10-CM

## 2023-03-12 ENCOUNTER — Ambulatory Visit (HOSPITAL_COMMUNITY)
Admission: RE | Admit: 2023-03-12 | Discharge: 2023-03-12 | Disposition: A | Discharge status: Home / Self Care | Payer: Medicare HMO | Source: Ambulatory Visit | Attending: Internal Medicine | Admitting: Internal Medicine

## 2023-03-12 DIAGNOSIS — Z1231 Encounter for screening mammogram for malignant neoplasm of breast: Secondary | ICD-10-CM | POA: Insufficient documentation

## 2023-08-07 ENCOUNTER — Ambulatory Visit: Payer: Self-pay | Admitting: Podiatry

## 2023-08-07 ENCOUNTER — Encounter: Payer: Self-pay | Admitting: Podiatry

## 2023-08-07 DIAGNOSIS — M79672 Pain in left foot: Secondary | ICD-10-CM | POA: Diagnosis not present

## 2023-08-07 DIAGNOSIS — B351 Tinea unguium: Secondary | ICD-10-CM | POA: Diagnosis not present

## 2023-08-07 DIAGNOSIS — M79671 Pain in right foot: Secondary | ICD-10-CM

## 2023-08-07 NOTE — Progress Notes (Signed)
 Patient presents for evaluation and treatment of tenderness and some redness around nails feet.  Tenderness around toes with walking and wearing shoes.  Physical exam:  General appearance: Alert, pleasant, and in no acute distress.  Vascular: Pedal pulses: DP 2/4 B/L, PT 0/4 B/L.  Mild edema lower legs bilaterally  Neurological:    Dermatologic:  Nails thickened, disfigured, discolored 1-5 BL with subungual debris.  Redness and hypertrophic nail folds along nail folds bilaterally but no signs of drainage or infection.  Musculoskeletal:     Diagnosis: 1. Painful onychomycotic nails 1 through 5 bilaterally. 2. Pain toes 1 through 5 bilaterally.  Plan: Debrided onychomycotic nails 1 through 5 bilaterally.  Return 3 months

## 2023-11-07 ENCOUNTER — Ambulatory Visit: Admitting: Podiatry

## 2024-03-14 ENCOUNTER — Other Ambulatory Visit (HOSPITAL_COMMUNITY): Payer: Self-pay | Admitting: Internal Medicine

## 2024-03-14 DIAGNOSIS — Z1231 Encounter for screening mammogram for malignant neoplasm of breast: Secondary | ICD-10-CM

## 2024-03-21 ENCOUNTER — Ambulatory Visit (HOSPITAL_COMMUNITY)
